# Patient Record
Sex: Male | Born: 1940 | Race: White | Hispanic: No | Marital: Married | Smoking: Never smoker
Health system: Southern US, Community
[De-identification: ages and names within clinical notes are randomized; demographics above are authoritative.]

## PROBLEM LIST (undated history)

## (undated) DIAGNOSIS — C241 Malignant neoplasm of ampulla of Vater: Secondary | ICD-10-CM

## (undated) DIAGNOSIS — E43 Unspecified severe protein-calorie malnutrition: Secondary | ICD-10-CM

## (undated) DIAGNOSIS — R066 Hiccough: Secondary | ICD-10-CM

## (undated) DIAGNOSIS — C4490 Unspecified malignant neoplasm of skin, unspecified: Secondary | ICD-10-CM

## (undated) DIAGNOSIS — I10 Essential (primary) hypertension: Secondary | ICD-10-CM

## (undated) DIAGNOSIS — C170 Malignant neoplasm of duodenum: Secondary | ICD-10-CM

## (undated) DIAGNOSIS — F410 Panic disorder [episodic paroxysmal anxiety] without agoraphobia: Secondary | ICD-10-CM

## (undated) DIAGNOSIS — B370 Candidal stomatitis: Secondary | ICD-10-CM

## (undated) DIAGNOSIS — E7800 Pure hypercholesterolemia, unspecified: Secondary | ICD-10-CM

## (undated) HISTORY — DX: Unspecified malignant neoplasm of skin, unspecified: C44.90

## (undated) HISTORY — DX: Malignant neoplasm of ampulla of Vater: C24.1

---

## 2013-08-20 ENCOUNTER — Other Ambulatory Visit (HOSPITAL_COMMUNITY): Payer: Self-pay | Admitting: Nurse Practitioner

## 2013-08-20 DIAGNOSIS — N508 Other specified disorders of male genital organs: Secondary | ICD-10-CM

## 2013-08-24 ENCOUNTER — Ambulatory Visit (HOSPITAL_COMMUNITY)
Admission: RE | Admit: 2013-08-24 | Discharge: 2013-08-24 | Disposition: A | Discharge status: Home / Self Care | Payer: Medicare Other | Source: Ambulatory Visit | Attending: Nurse Practitioner | Admitting: Nurse Practitioner

## 2013-08-24 ENCOUNTER — Other Ambulatory Visit (HOSPITAL_COMMUNITY): Payer: Self-pay | Admitting: Nurse Practitioner

## 2013-08-24 DIAGNOSIS — N433 Hydrocele, unspecified: Secondary | ICD-10-CM | POA: Insufficient documentation

## 2013-08-24 DIAGNOSIS — N508 Other specified disorders of male genital organs: Secondary | ICD-10-CM | POA: Insufficient documentation

## 2013-09-15 ENCOUNTER — Encounter (HOSPITAL_COMMUNITY): Payer: Self-pay | Admitting: Emergency Medicine

## 2013-09-15 ENCOUNTER — Inpatient Hospital Stay (HOSPITAL_COMMUNITY)
Admission: EM | Admit: 2013-09-15 | Discharge: 2013-09-17 | DRG: 445 | Disposition: A | Discharge status: Other Acute Inpatient Hospital | Payer: Medicare Other | Attending: Internal Medicine | Admitting: Internal Medicine

## 2013-09-15 ENCOUNTER — Emergency Department (HOSPITAL_COMMUNITY): Payer: Medicare Other

## 2013-09-15 DIAGNOSIS — N2889 Other specified disorders of kidney and ureter: Secondary | ICD-10-CM

## 2013-09-15 DIAGNOSIS — N19 Unspecified kidney failure: Secondary | ICD-10-CM

## 2013-09-15 DIAGNOSIS — I10 Essential (primary) hypertension: Secondary | ICD-10-CM

## 2013-09-15 DIAGNOSIS — K315 Obstruction of duodenum: Secondary | ICD-10-CM | POA: Diagnosis present

## 2013-09-15 DIAGNOSIS — N289 Disorder of kidney and ureter, unspecified: Secondary | ICD-10-CM

## 2013-09-15 DIAGNOSIS — C801 Malignant (primary) neoplasm, unspecified: Secondary | ICD-10-CM | POA: Diagnosis present

## 2013-09-15 DIAGNOSIS — K831 Obstruction of bile duct: Principal | ICD-10-CM

## 2013-09-15 DIAGNOSIS — R7402 Elevation of levels of lactic acid dehydrogenase (LDH): Secondary | ICD-10-CM | POA: Diagnosis present

## 2013-09-15 DIAGNOSIS — R7401 Elevation of levels of liver transaminase levels: Secondary | ICD-10-CM | POA: Diagnosis present

## 2013-09-15 DIAGNOSIS — Z87440 Personal history of urinary (tract) infections: Secondary | ICD-10-CM

## 2013-09-15 DIAGNOSIS — K869 Disease of pancreas, unspecified: Secondary | ICD-10-CM | POA: Diagnosis present

## 2013-09-15 DIAGNOSIS — N189 Chronic kidney disease, unspecified: Secondary | ICD-10-CM | POA: Diagnosis present

## 2013-09-15 DIAGNOSIS — D649 Anemia, unspecified: Secondary | ICD-10-CM | POA: Diagnosis present

## 2013-09-15 DIAGNOSIS — K3189 Other diseases of stomach and duodenum: Secondary | ICD-10-CM

## 2013-09-15 DIAGNOSIS — Z87442 Personal history of urinary calculi: Secondary | ICD-10-CM

## 2013-09-15 DIAGNOSIS — I129 Hypertensive chronic kidney disease with stage 1 through stage 4 chronic kidney disease, or unspecified chronic kidney disease: Secondary | ICD-10-CM | POA: Diagnosis present

## 2013-09-15 DIAGNOSIS — E7800 Pure hypercholesterolemia, unspecified: Secondary | ICD-10-CM | POA: Diagnosis present

## 2013-09-15 DIAGNOSIS — N179 Acute kidney failure, unspecified: Secondary | ICD-10-CM | POA: Diagnosis present

## 2013-09-15 DIAGNOSIS — N1330 Unspecified hydronephrosis: Secondary | ICD-10-CM | POA: Diagnosis present

## 2013-09-15 DIAGNOSIS — R17 Unspecified jaundice: Secondary | ICD-10-CM | POA: Diagnosis present

## 2013-09-15 DIAGNOSIS — E876 Hypokalemia: Secondary | ICD-10-CM | POA: Diagnosis present

## 2013-09-15 DIAGNOSIS — F410 Panic disorder [episodic paroxysmal anxiety] without agoraphobia: Secondary | ICD-10-CM | POA: Diagnosis present

## 2013-09-15 DIAGNOSIS — K319 Disease of stomach and duodenum, unspecified: Secondary | ICD-10-CM | POA: Diagnosis present

## 2013-09-15 HISTORY — DX: Panic disorder (episodic paroxysmal anxiety): F41.0

## 2013-09-15 HISTORY — DX: Essential (primary) hypertension: I10

## 2013-09-15 HISTORY — DX: Pure hypercholesterolemia, unspecified: E78.00

## 2013-09-15 LAB — CBC WITH DIFFERENTIAL/PLATELET
Basophils Absolute: 0 10*3/uL (ref 0.0–0.1)
Eosinophils Relative: 3 % (ref 0–5)
Lymphocytes Relative: 34 % (ref 12–46)
Lymphs Abs: 3.6 10*3/uL (ref 0.7–4.0)
MCH: 32.1 pg (ref 26.0–34.0)
MCV: 98 fL (ref 78.0–100.0)
Monocytes Relative: 8 % (ref 3–12)
Neutro Abs: 5.9 10*3/uL (ref 1.7–7.7)
Neutrophils Relative %: 56 % (ref 43–77)
Platelets: 489 10*3/uL — ABNORMAL HIGH (ref 150–400)
RBC: 3.92 MIL/uL — ABNORMAL LOW (ref 4.22–5.81)
WBC: 10.6 10*3/uL — ABNORMAL HIGH (ref 4.0–10.5)

## 2013-09-15 LAB — URINALYSIS, ROUTINE W REFLEX MICROSCOPIC
Glucose, UA: NEGATIVE mg/dL
Hgb urine dipstick: NEGATIVE
Leukocytes, UA: NEGATIVE
Nitrite: NEGATIVE
Protein, ur: NEGATIVE mg/dL
Specific Gravity, Urine: 1.02 (ref 1.005–1.030)
Urobilinogen, UA: 1 mg/dL (ref 0.0–1.0)

## 2013-09-15 LAB — COMPREHENSIVE METABOLIC PANEL
ALT: 140 U/L — ABNORMAL HIGH (ref 0–53)
AST: 115 U/L — ABNORMAL HIGH (ref 0–37)
Alkaline Phosphatase: 544 U/L — ABNORMAL HIGH (ref 39–117)
BUN: 24 mg/dL — ABNORMAL HIGH (ref 6–23)
CO2: 25 mEq/L (ref 19–32)
GFR calc Af Amer: 34 mL/min — ABNORMAL LOW (ref >90)
GFR calc non Af Amer: 29 mL/min — ABNORMAL LOW (ref >90)
Glucose, Bld: 102 mg/dL — ABNORMAL HIGH (ref 70–99)
Potassium: 3.4 mEq/L — ABNORMAL LOW (ref 3.5–5.1)
Sodium: 137 mEq/L (ref 135–145)

## 2013-09-15 LAB — LACTIC ACID, PLASMA: Lactic Acid, Venous: 1.4 mmol/L (ref 0.5–2.2)

## 2013-09-15 LAB — PROTIME-INR
INR: 1.03 (ref 0.00–1.49)
Prothrombin Time: 13.3 seconds (ref 11.6–15.2)

## 2013-09-15 LAB — SALICYLATE LEVEL: Salicylate Lvl: <2 mg/dL — ABNORMAL LOW (ref 2.8–20.0)

## 2013-09-15 LAB — ACETAMINOPHEN LEVEL: Acetaminophen (Tylenol), Serum: <15 ug/mL (ref 10–30)

## 2013-09-15 MED ORDER — ENOXAPARIN SODIUM 40 MG/0.4ML ~~LOC~~ SOLN: 40.0000 mg | SUBCUTANEOUS | Status: DC

## 2013-09-15 MED ORDER — IOHEXOL 300 MG/ML  SOLN
50.0000 mL | Freq: Once | INTRAMUSCULAR | Status: AC | PRN
  Administered 2013-09-15: 50 mL via ORAL

## 2013-09-15 MED ORDER — ONDANSETRON HCL 4 MG/2ML IJ SOLN: 4.0000 mg | Freq: Three times a day (TID) | INTRAMUSCULAR | Status: AC | PRN

## 2013-09-15 MED ORDER — SODIUM CHLORIDE 0.9 % IV SOLN: INTRAVENOUS | Status: DC

## 2013-09-15 MED ORDER — SODIUM CHLORIDE 0.9 % IV SOLN
INTRAVENOUS | Status: DC
  Administered 2013-09-15 – 2013-09-17 (×5): via INTRAVENOUS

## 2013-09-15 NOTE — H&P (Signed)
PCP:   Quinn Axe, PA-C   Chief Complaint:  Abnormal labs  HPI: 71 year old male with no significant medical problems who came to the ED after patient had a routine blood test at the PCP office which showed abnormal liver function test and elevated creatinine. Patient says that he was diagnosed with UTI in October and was prescribed antibiotics. Patient had symptoms of right upper quadrant pain with nausea at that time which resolved after 3 days. Patient was found to have elevated bilirubin in the urine so LFTs were done which were abnormal. Patient denies history of heavy alcohol abuse though he drinks one glass of wine occasionally at night. He denies any history of cancer, denies fever. Patient is totally asymptomatic at this time, he denies nausea vomiting or diarrhea no abdominal pain no fever no dysuria urgency frequency of urination. He denies constipation. Patient admits to having history of weight loss. In the ED CT scan of the abdominal was done which showed high-grade biliary obstruction secondary to peri-ampullary region mass 5 cm in dimension with evidence of extension beyond the duodenum with Molly Maduro of the proximal right ureter causing moderate hydronephrosis.  Allergies:   Allergies  Allergen Reactions  . Sulfa Antibiotics       Past Medical History  Diagnosis Date  . Hypertension   . Hypercholesterolemia   . Panic attacks     History reviewed. No pertinent past surgical history.  Prior to Admission medications   Not on File    Social History:  reports that he has never smoked. He does not have any smokeless tobacco history on file. He reports that he drinks alcohol. He reports that he does not use illicit drugs.  No family history on file.   All the positives are listed in BOLD  Review of Systems:  HEENT: Headache, blurred vision, runny nose, sore throat Neck: Hypothyroidism, hyperthyroidism,,lymphadenopathy Chest : Shortness of breath, history of  COPD, Asthma Heart : Chest pain, history of coronary arterey disease GI:  Nausea, vomiting, diarrhea, constipation, GERD GU: Dysuria, urgency, frequency of urination, hematuria Neuro: Stroke, seizures, syncope Psych: Depression, anxiety, hallucinations   Physical Exam: Blood pressure 126/68, pulse 80, temperature 98.2 F (36.8 C), temperature source Oral, resp. rate 20, height 5\' 11"  (1.803 m), weight 88.769 kg (195 lb 11.2 oz), SpO2 97.00%. Constitutional:   Patient is a well-developed and well-nourished male in no acute distress and cooperative with exam. Head: Normocephalic and atraumatic Mouth: Mucus membranes moist Eyes: PERRL, EOMI, conjunctivae normal Neck: Supple, No Thyromegaly Cardiovascular: RRR, S1 normal, S2 normal Pulmonary/Chest: CTAB, no wheezes, rales, or rhonchi Abdominal: Soft. Non-tender, non-distended, bowel sounds are normal, no masses, organomegaly, or guarding present.  Neurological: A&O x3, Strenght is normal and symmetric bilaterally, cranial nerve II-XII are grossly intact, no focal motor deficit, sensory intact to light touch bilaterally.  Extremities : No Cyanosis, Clubbing or Edema   Labs on Admission:  Results for orders placed during the hospital encounter of 09/15/13 (from the past 48 hour(s))  CBC WITH DIFFERENTIAL     Status: Abnormal   Collection Time    09/15/13  6:00 PM      Result Value Range   WBC 10.6 (*) 4.0 - 10.5 K/uL   RBC 3.92 (*) 4.22 - 5.81 MIL/uL   Hemoglobin 12.6 (*) 13.0 - 17.0 g/dL   HCT 16.1 (*) 09.6 - 04.5 %   MCV 98.0  78.0 - 100.0 fL   MCH 32.1  26.0 - 34.0 pg  MCHC 32.8  30.0 - 36.0 g/dL   RDW 29.5 (*) 62.1 - 30.8 %   Platelets 489 (*) 150 - 400 K/uL   Neutrophils Relative % 56  43 - 77 %   Neutro Abs 5.9  1.7 - 7.7 K/uL   Lymphocytes Relative 34  12 - 46 %   Lymphs Abs 3.6  0.7 - 4.0 K/uL   Monocytes Relative 8  3 - 12 %   Monocytes Absolute 0.8  0.1 - 1.0 K/uL   Eosinophils Relative 3  0 - 5 %   Eosinophils  Absolute 0.3  0.0 - 0.7 K/uL   Basophils Relative 0  0 - 1 %   Basophils Absolute 0.0  0.0 - 0.1 K/uL  COMPREHENSIVE METABOLIC PANEL     Status: Abnormal   Collection Time    09/15/13  6:00 PM      Result Value Range   Sodium 137  135 - 145 mEq/L   Potassium 3.4 (*) 3.5 - 5.1 mEq/L   Chloride 101  96 - 112 mEq/L   CO2 25  19 - 32 mEq/L   Glucose, Bld 102 (*) 70 - 99 mg/dL   BUN 24 (*) 6 - 23 mg/dL   Creatinine, Ser 6.57 (*) 0.50 - 1.35 mg/dL   Calcium 9.4  8.4 - 84.6 mg/dL   Total Protein 7.5  6.0 - 8.3 g/dL   Albumin 3.0 (*) 3.5 - 5.2 g/dL   AST 962 (*) 0 - 37 U/L   ALT 140 (*) 0 - 53 U/L   Alkaline Phosphatase 544 (*) 39 - 117 U/L   Total Bilirubin 6.8 (*) 0.3 - 1.2 mg/dL   GFR calc non Af Amer 29 (*) >90 mL/min   GFR calc Af Amer 34 (*) >90 mL/min   Comment: (NOTE)     The eGFR has been calculated using the CKD EPI equation.     This calculation has not been validated in all clinical situations.     eGFR's persistently <90 mL/min signify possible Chronic Kidney     Disease.  LIPASE, BLOOD     Status: None   Collection Time    09/15/13  6:00 PM      Result Value Range   Lipase 56  11 - 59 U/L  PROTIME-INR     Status: None   Collection Time    09/15/13  6:00 PM      Result Value Range   Prothrombin Time 13.3  11.6 - 15.2 seconds   INR 1.03  0.00 - 1.49  LACTIC ACID, PLASMA     Status: None   Collection Time    09/15/13  6:00 PM      Result Value Range   Lactic Acid, Venous 1.4  0.5 - 2.2 mmol/L  ACETAMINOPHEN LEVEL     Status: None   Collection Time    09/15/13  6:00 PM      Result Value Range   Acetaminophen (Tylenol), Serum <15.0  10 - 30 ug/mL   Comment:            THERAPEUTIC CONCENTRATIONS VARY     SIGNIFICANTLY. A RANGE OF 10-30     ug/mL MAY BE AN EFFECTIVE     CONCENTRATION FOR MANY PATIENTS.     HOWEVER, SOME ARE BEST TREATED     AT CONCENTRATIONS OUTSIDE THIS     RANGE.     ACETAMINOPHEN CONCENTRATIONS     >150 ug/mL AT 4 HOURS AFTER  INGESTION  AND >50 ug/mL AT 12     HOURS AFTER INGESTION ARE     OFTEN ASSOCIATED WITH TOXIC     REACTIONS.  SALICYLATE LEVEL     Status: Abnormal   Collection Time    09/15/13  6:00 PM      Result Value Range   Salicylate Lvl <2.0 (*) 2.8 - 20.0 mg/dL  ETHANOL     Status: None   Collection Time    09/15/13  6:00 PM      Result Value Range   Alcohol, Ethyl (B) <11  0 - 11 mg/dL   Comment:            LOWEST DETECTABLE LIMIT FOR     SERUM ALCOHOL IS 11 mg/dL     FOR MEDICAL PURPOSES ONLY  AMMONIA     Status: None   Collection Time    09/15/13  7:11 PM      Result Value Range   Ammonia 29  11 - 60 umol/L  URINALYSIS, ROUTINE W REFLEX MICROSCOPIC     Status: Abnormal   Collection Time    09/15/13  7:58 PM      Result Value Range   Color, Urine YELLOW  YELLOW   APPearance CLEAR  CLEAR   Specific Gravity, Urine 1.020  1.005 - 1.030   pH 6.0  5.0 - 8.0   Glucose, UA NEGATIVE  NEGATIVE mg/dL   Hgb urine dipstick NEGATIVE  NEGATIVE   Bilirubin Urine MODERATE (*) NEGATIVE   Ketones, ur NEGATIVE  NEGATIVE mg/dL   Protein, ur NEGATIVE  NEGATIVE mg/dL   Urobilinogen, UA 1.0  0.0 - 1.0 mg/dL   Nitrite NEGATIVE  NEGATIVE   Leukocytes, UA NEGATIVE  NEGATIVE   Comment: MICROSCOPIC NOT DONE ON URINES WITH NEGATIVE PROTEIN, BLOOD, LEUKOCYTES, NITRITE, OR GLUCOSE <1000 mg/dL.    Radiological Exams on Admission: Ct Abdomen Pelvis Wo Contrast  09/15/2013   CLINICAL DATA:  Jaundice  EXAM: CT ABDOMEN AND PELVIS WITHOUT CONTRAST  TECHNIQUE: Multidetector CT imaging of the abdomen and pelvis was performed following the standard protocol without intravenous contrast.  COMPARISON:  None.  FINDINGS: BODY WALL: Fatty right inguinal hernia.  LOWER CHEST: Unremarkable.  ABDOMEN/PELVIS:  Liver: No focal abnormality.  Biliary: Intra and extrahepatic biliary dilatation, with the common bile duct measuring up to 23 mm diameter. The duct is dilated although the way to the papilla, were there is a ill-defined mass  nearly filling the the duodenum, 5 cm in length by 2.3 cm in diameter. The posterior margin of the duodenal C-loop has surrounding fat infiltration, possibly involving the proximal upper ureter given moderate right hydronephrosis. No definitive tip adenopathy.  Cholelithiasis.  Pancreas: Appears discrete from the above mass. No ductal dilatation.  Spleen: Unremarkable.  Adrenals: Unremarkable.  Kidneys and ureters: Moderate right hydronephrosis, likely from extrinsic ureteral compression, as above. There is a water density mass exophytic from the upper pole left kidney, approximately 2 cm. On the right, there is a water density mass exophytic from the interpolar region. There is a soft tissue density mass exophytic from the lower pole right kidney, 2 cm in diameter.  Bladder: Unremarkable.  Reproductive: Unremarkable.  Bowel: Mid duodenal mass as above. Colonic diverticulosis. No bowel obstruction. Normal appendix.  Retroperitoneum: No mass or adenopathy.  Peritoneum: No free fluid or gas.  Vascular: No acute abnormality.  OSSEOUS: No acute abnormalities.  Right-sided L5 pars defect.  IMPRESSION: 1. High-grade biliary obstruction secondary to periampullary region  mass, 5 cm in maximal dimension. Endoscopy would likely allow for tissue sampling. There is evidence of extension beyond the duodenum, with involvement of the proximal right ureter - causing moderate hydronephrosis. 2. Indeterminate 2 cm right renal mass, which could be a complicated cyst or solid neoplasm.   Electronically Signed   By: [name]   On: 09/15/2013 21:06    Assessment/Plan Active Problems:   Biliary obstruction   Biliary obstruction due to malignant neoplasm  72 year old male with no significant medical problems admitted with abnormal LFTs, jaundice, moderate hydronephrosis secondary to mass encroaching upon proximal right ureter. Patient will need GI to evaluate for possible endoscopy in the morning. We'll consult GI in  the morning. Patient will need a biopsy of the mass to further evaluate the etiology. Patient does have elevated creatinine 2.17 likely due to hydronephrosis. Based on the evaluation as per endoscopy, consider urology consult in the morning.  Code status: Patient is full code  Family discussion: Discussed with patient's wife at bedside   Time Spent on Admission: 55 min  [facility] Pager: [phone] 09/15/2013, 11:41 PM  If 7PM-7AM, please contact night-coverage  [email or web address]  Password [email or web address]

## 2013-09-15 NOTE — ED Provider Notes (Signed)
CSN: [id number]     Arrival date & time 09/15/13  1732 History  This chart was scribed for Trevor Octave, MD by Danella Maiers, ED Scribe. This patient was seen in room APA06/APA06 and the patient's care was started at 5:58 PM.    Chief Complaint  Patient presents with  . abnormal labs    The history is provided by the patient. No language interpreter was used.   HPI Comments: [name] is a 72 y.o. male who presents to the Emergency Department from having a routine blood test at his PCP because he has large amount of bilirubin in urine, abnormal liver tests, and elevated creatinine. Pt reports he was treated for UTI in October. Pt states he has been told he looks yellow since this admit in October. Pt also reports one episode vomiting last week. He states he had one mixed drink in October that made his stomach feel like it was on fire and has not drank alcohol since. He states before this, he drank alcohol once in a while. He denies recent illness, abdominal pain, fatigue, fever, cough, diarrhea, constipation. He still has his appendix and gall bladder. He is otherwise healthy.    Past Medical History  Diagnosis Date  . Hypertension   . Hypercholesterolemia   . Panic attacks    History reviewed. No pertinent past surgical history. No family history on file. History  Substance Use Topics  . Smoking status: Never Smoker   . Smokeless tobacco: Not on file  . Alcohol Use: Yes     Comment: reports drank moderately until Oct 15th, none since then.    Review of Systems  Constitutional: Negative for fatigue.  Respiratory: Negative for cough.   Gastrointestinal: Positive for vomiting (last week). Negative for nausea, abdominal pain, diarrhea and constipation.   A complete 10 system review of systems was obtained and all systems are negative except as noted in the HPI and PMH.   Allergies  Sulfa antibiotics  Home Medications  No current outpatient prescriptions on file. BP  128/71  Pulse 97  Temp(Src) 97.8 F (36.6 C) (Oral)  Resp 20  Ht 5\' 11"  (1.803 m)  Wt 194 lb (87.998 kg)  BMI 27.07 kg/m2  SpO2 100% Physical Exam  Nursing note and vitals reviewed. Constitutional: He is oriented to person, place, and time. He appears well-developed and well-nourished. No distress.  Mild jaundice  HENT:  Head: Normocephalic and atraumatic.  Mouth/Throat: Oropharynx is clear and moist. No oropharyngeal exudate.  Eyes: EOM are normal. Scleral icterus is present.  Neck: Neck supple. No tracheal deviation present.  Cardiovascular: Normal rate, regular rhythm and normal heart sounds.   No murmur heard. Pulmonary/Chest: Effort normal. No respiratory distress.  Abdominal: Soft. There is no tenderness. There is no rebound and no guarding.  Musculoskeletal: Normal range of motion.  Neurological: He is alert and oriented to person, place, and time. No cranial nerve deficit. He exhibits normal muscle tone. Coordination normal.  No asterixis  Skin: Skin is warm and dry.  Psychiatric: He has a normal mood and affect. His behavior is normal.    ED Course  Procedures (including critical care time) Medications  0.9 %  sodium chloride infusion (not administered)  ondansetron (ZOFRAN) injection 4 mg (not administered)  iohexol (OMNIPAQUE) 300 MG/ML solution 50 mL (50 mLs Oral Contrast Given 09/15/13 1912)    DIAGNOSTIC STUDIES: Oxygen Saturation is 100% on RA, normal by my interpretation.    COORDINATION OF CARE: 6:12 PM- Discussed  treatment plan with pt which includes lab work. Pt agrees to plan.  9:00 PM- Rechecked with pt to let him know that we are still waiting on CT and pt will probably need to be admitted. Pt agrees to plan.    Labs Review Labs Reviewed  CBC WITH DIFFERENTIAL - Abnormal; Notable for the following:    WBC 10.6 (*)    RBC 3.92 (*)    Hemoglobin 12.6 (*)    HCT 38.4 (*)    RDW 16.4 (*)    Platelets 489 (*)    All other components within  normal limits  COMPREHENSIVE METABOLIC PANEL - Abnormal; Notable for the following:    Potassium 3.4 (*)    Glucose, Bld 102 (*)    BUN 24 (*)    Creatinine, Ser 2.17 (*)    Albumin 3.0 (*)    AST 115 (*)    ALT 140 (*)    Alkaline Phosphatase 544 (*)    Total Bilirubin 6.8 (*)    GFR calc non Af Amer 29 (*)    GFR calc Af Amer 34 (*)    All other components within normal limits  URINALYSIS, ROUTINE W REFLEX MICROSCOPIC - Abnormal; Notable for the following:    Bilirubin Urine MODERATE (*)    All other components within normal limits  SALICYLATE LEVEL - Abnormal; Notable for the following:    Salicylate Lvl <2.0 (*)    All other components within normal limits  LIPASE, BLOOD  PROTIME-INR  AMMONIA  LACTIC ACID, PLASMA  ACETAMINOPHEN LEVEL  ETHANOL   Imaging Review Ct Abdomen Pelvis Wo Contrast  09/15/2013   CLINICAL DATA:  Jaundice  EXAM: CT ABDOMEN AND PELVIS WITHOUT CONTRAST  TECHNIQUE: Multidetector CT imaging of the abdomen and pelvis was performed following the standard protocol without intravenous contrast.  COMPARISON:  None.  FINDINGS: BODY WALL: Fatty right inguinal hernia.  LOWER CHEST: Unremarkable.  ABDOMEN/PELVIS:  Liver: No focal abnormality.  Biliary: Intra and extrahepatic biliary dilatation, with the common bile duct measuring up to 23 mm diameter. The duct is dilated although the way to the papilla, were there is a ill-defined mass nearly filling the the duodenum, 5 cm in length by 2.3 cm in diameter. The posterior margin of the duodenal C-loop has surrounding fat infiltration, possibly involving the proximal upper ureter given moderate right hydronephrosis. No definitive tip adenopathy.  Cholelithiasis.  Pancreas: Appears discrete from the above mass. No ductal dilatation.  Spleen: Unremarkable.  Adrenals: Unremarkable.  Kidneys and ureters: Moderate right hydronephrosis, likely from extrinsic ureteral compression, as above. There is a water density mass exophytic from  the upper pole left kidney, approximately 2 cm. On the right, there is a water density mass exophytic from the interpolar region. There is a soft tissue density mass exophytic from the lower pole right kidney, 2 cm in diameter.  Bladder: Unremarkable.  Reproductive: Unremarkable.  Bowel: Mid duodenal mass as above. Colonic diverticulosis. No bowel obstruction. Normal appendix.  Retroperitoneum: No mass or adenopathy.  Peritoneum: No free fluid or gas.  Vascular: No acute abnormality.  OSSEOUS: No acute abnormalities.  Right-sided L5 pars defect.  IMPRESSION: 1. High-grade biliary obstruction secondary to periampullary region mass, 5 cm in maximal dimension. Endoscopy would likely allow for tissue sampling. There is evidence of extension beyond the duodenum, with involvement of the proximal right ureter - causing moderate hydronephrosis. 2. Indeterminate 2 cm right renal mass, which could be a complicated cyst or solid neoplasm.   Electronically  Signed   By: [name]   On: 09/15/2013 21:06    EKG Interpretation   None       MDM   1. Biliary obstruction   2. Renal failure   3. Renal mass   4. Duodenal mass    Lab abnormalities on outpatient. No abdominal pain, fever, vomiting, chest pain, SOB.  Hyperbilirubinemia with transaminitis. Lipase negative. Creatinine 2.0.  Concern for biliary or pancreas pathology given patient's jaundice and lab abnormalities. He denies any abdominal pain, nausea vomiting.  Imaging shows biliary obstruction from periampullar mass. Moderate hydronephrosis from same. Also cystic lesion on kidney.  Concern for malignancy. Patient will need admission for further evaluation and endoscopy. I personally performed the services described in this documentation, which was scribed in my presence. The recorded information has been reviewed and is accurate.    Trevor Octave, MD 09/15/13 2139

## 2013-09-15 NOTE — ED Notes (Signed)
Pt reports was treated for UTI back in October.  Reports was sent today by PCP because has large amount of bilirubin in urine, abnormal liver tests, and elevated creatinine.  Pt also appears jaundice.  Office staff also reports pt c/o some abdominal discomfort with n/v after eating certain foods.

## 2013-09-16 ENCOUNTER — Encounter (HOSPITAL_COMMUNITY)
Admission: EM | Disposition: A | Discharge status: Other Acute Inpatient Hospital | Payer: Self-pay | Source: Home / Self Care | Attending: Internal Medicine

## 2013-09-16 ENCOUNTER — Encounter (HOSPITAL_COMMUNITY): Payer: Self-pay | Admitting: Anesthesiology

## 2013-09-16 ENCOUNTER — Encounter (HOSPITAL_COMMUNITY): Payer: Self-pay | Admitting: *Deleted

## 2013-09-16 DIAGNOSIS — N179 Acute kidney failure, unspecified: Secondary | ICD-10-CM | POA: Diagnosis present

## 2013-09-16 DIAGNOSIS — I10 Essential (primary) hypertension: Secondary | ICD-10-CM

## 2013-09-16 DIAGNOSIS — R17 Unspecified jaundice: Secondary | ICD-10-CM

## 2013-09-16 DIAGNOSIS — N1330 Unspecified hydronephrosis: Secondary | ICD-10-CM | POA: Diagnosis present

## 2013-09-16 LAB — URINALYSIS, ROUTINE W REFLEX MICROSCOPIC
Ketones, ur: NEGATIVE mg/dL
Leukocytes, UA: NEGATIVE
Nitrite: NEGATIVE
Specific Gravity, Urine: 1.02 (ref 1.005–1.030)
Urobilinogen, UA: 1 mg/dL (ref 0.0–1.0)
pH: 6 (ref 5.0–8.0)

## 2013-09-16 LAB — BASIC METABOLIC PANEL
BUN: 21 mg/dL (ref 6–23)
Calcium: 9.9 mg/dL (ref 8.4–10.5)
Chloride: 101 mEq/L (ref 96–112)
Creatinine, Ser: 1.92 mg/dL — ABNORMAL HIGH (ref 0.50–1.35)
GFR calc Af Amer: 39 mL/min — ABNORMAL LOW (ref >90)
Glucose, Bld: 118 mg/dL — ABNORMAL HIGH (ref 70–99)

## 2013-09-16 LAB — SODIUM, URINE, RANDOM: Sodium, Ur: 116 mEq/L

## 2013-09-16 LAB — CREATININE, URINE, RANDOM: Creatinine, Urine: 117.39 mg/dL

## 2013-09-16 SURGERY — CANCELLED PROCEDURE

## 2013-09-16 MED ORDER — LACTATED RINGERS IV SOLN: INTRAVENOUS | Status: DC

## 2013-09-16 MED ORDER — CEFAZOLIN SODIUM-DEXTROSE 2-3 GM-% IV SOLR
2.0000 g | Freq: Once | INTRAVENOUS | Status: AC
  Filled 2013-09-16: qty 50

## 2013-09-16 MED ORDER — ONDANSETRON HCL 4 MG/2ML IJ SOLN: 4.0000 mg | Freq: Once | INTRAMUSCULAR | Status: DC | PRN

## 2013-09-16 MED ORDER — FENTANYL CITRATE 0.05 MG/ML IJ SOLN: 25.0000 ug | INTRAMUSCULAR | Status: DC | PRN

## 2013-09-16 MED ORDER — ENOXAPARIN SODIUM 40 MG/0.4ML ~~LOC~~ SOLN
SUBCUTANEOUS | Status: AC
  Filled 2013-09-16: qty 0.4

## 2013-09-16 MED ORDER — ONDANSETRON HCL 4 MG/2ML IJ SOLN: 4.0000 mg | Freq: Once | INTRAMUSCULAR | Status: DC

## 2013-09-16 MED ORDER — ONDANSETRON HCL 4 MG/2ML IJ SOLN
INTRAMUSCULAR | Status: AC
  Filled 2013-09-16: qty 2

## 2013-09-16 MED ORDER — SODIUM CHLORIDE 0.9 % IV SOLN: INTRAVENOUS | Status: DC

## 2013-09-16 MED ORDER — MIDAZOLAM HCL 2 MG/2ML IJ SOLN: 1.0000 mg | INTRAMUSCULAR | Status: DC | PRN

## 2013-09-16 MED ORDER — FENTANYL CITRATE 0.05 MG/ML IJ SOLN
INTRAMUSCULAR | Status: AC
  Filled 2013-09-16: qty 2

## 2013-09-16 MED ORDER — GLYCOPYRROLATE 0.2 MG/ML IJ SOLN: 0.2000 mg | Freq: Once | INTRAMUSCULAR | Status: DC

## 2013-09-16 MED ORDER — MIDAZOLAM HCL 2 MG/2ML IJ SOLN
INTRAMUSCULAR | Status: AC
  Filled 2013-09-16: qty 2

## 2013-09-16 SURGICAL SUPPLY — 40 items
BAG HAMPER (MISCELLANEOUS) ×3 IMPLANT
BALLN RETRIEVAL 12X15 (BALLOONS) IMPLANT
BASKET TRAPEZOID 3X6 (MISCELLANEOUS) IMPLANT
BLOCK BITE 60FR ADLT L/F BLUE (MISCELLANEOUS) ×3 IMPLANT
DEVICE INFLATION ENCORE 26 (MISCELLANEOUS) IMPLANT
DEVICE LOCKING W-BIOPSY CAP (MISCELLANEOUS) ×3 IMPLANT
ELECT REM PT RETURN 9FT ADLT (ELECTROSURGICAL)
ELECTRODE REM PT RTRN 9FT ADLT (ELECTROSURGICAL) IMPLANT
FLOOR PAD 36X40 (MISCELLANEOUS)
FORCEP COLD BIOPSY (CUTTING FORCEPS) IMPLANT
FORCEP RJ3 GP 1.8X160 W-NEEDLE (CUTTING FORCEPS) IMPLANT
FORCEPS BIOP RAD 4 LRG CAP 4 (CUTTING FORCEPS) IMPLANT
GUIDEWIRE HYDRA JAGWIRE .35 (WIRE) IMPLANT
GUIDEWIRE JAG HINI 025X260CM (WIRE) IMPLANT
KIT CLEAN ENDO COMPLIANCE (KITS) ×3 IMPLANT
KIT ROOM TURNOVER APOR (KITS) ×3 IMPLANT
LUBRICANT JELLY 4.5OZ STERILE (MISCELLANEOUS) IMPLANT
MANIFOLD NEPTUNE WASTE (CANNULA) IMPLANT
NEEDLE HYPO 18GX1.5 BLUNT FILL (NEEDLE) IMPLANT
NEEDLE SCLEROTHERAPY 25GX240 (NEEDLE) IMPLANT
PAD ARMBOARD 7.5X6 YLW CONV (MISCELLANEOUS) ×3 IMPLANT
PAD FLOOR 36X40 (MISCELLANEOUS) IMPLANT
PATHFINDER 450CM 0.18 (STENTS) IMPLANT
POSITIONER HEAD 8X9X4 ADT (SOFTGOODS) IMPLANT
PROBE APC STR FIRE (PROBE) IMPLANT
PROBE INJECTION GOLD (MISCELLANEOUS)
PROBE INJECTION GOLD 7FR (MISCELLANEOUS) IMPLANT
SNARE ROTATE MED OVAL 20MM (MISCELLANEOUS) IMPLANT
SNARE SHORT THROW 13M SML OVAL (MISCELLANEOUS) ×3 IMPLANT
SPHINCTEROTOME AUTOTOME .25 (MISCELLANEOUS) ×6 IMPLANT
SPHINCTEROTOME HYDRATOME 44 (MISCELLANEOUS) ×6 IMPLANT
SPONGE GAUZE 4X4 12PLY (GAUZE/BANDAGES/DRESSINGS) ×3 IMPLANT
SYR 3ML LL SCALE MARK (SYRINGE) IMPLANT
SYR 50ML LL SCALE MARK (SYRINGE) IMPLANT
SYSTEM CONTINUOUS INJECTION (MISCELLANEOUS) ×3 IMPLANT
TUBING ENDO SMARTCAP PENTAX (MISCELLANEOUS) ×3 IMPLANT
TUBING IRRIGATION ENDOGATOR (MISCELLANEOUS) ×3 IMPLANT
WALLSTENT METAL COVERED 10X60 (STENTS) IMPLANT
WALLSTENT METAL COVERED 10X80 (STENTS) IMPLANT
WATER STERILE IRR 1000ML POUR (IV SOLUTION) ×3 IMPLANT

## 2013-09-16 NOTE — Consult Note (Signed)
Schedule renal ultrasound tonite

## 2013-09-16 NOTE — Consult Note (Signed)
Reason for Consult: jaundice Referring Physician: Hospitalist  Odessa Morren is an 72 y.o. male.  HPI: Admitted thru the ED yesterday. He has been seen at [facility] and noted his bilirubin was very elevated. He says he has noticed yellowing of his skin for about 3 weeks. Admission bilirubin 6.8. He underwent a CT in the ED which revealed IMPRESSION:  1. High-grade biliary obstruction secondary to periampullary region  mass, 5 cm in maximal dimension. Endoscopy would likely allow for  tissue sampling. There is evidence of extension beyond the duodenum,  with involvement of the proximal right ureter - causing moderate  hydronephrosis.  2. Indeterminate 2 cm right renal mass, which could be a complicated  cyst or solid neoplasm.  He denies any pain. He tells me he had a UTI and treated with Cipro last month. He had pain under his rt rib cage. He has had recent weight loss of about 15 pounds. Appetite has been good. BMs are normal.  No problems with urination. He occasionally has a cocktail but not everyday. Has not drank x 2 months.   Past Medical History  Diagnosis Date  . Hypertension   . Hypercholesterolemia   . Panic attacks     History reviewed. No pertinent past surgical history.  No family history on file.  Social History:  reports that he has never smoked. He does not have any smokeless tobacco history on file. He reports that he drinks alcohol. He reports that he does not use illicit drugs.  Allergies:  Allergies  Allergen Reactions  . Sulfa Antibiotics     Medications: I have reviewed the patient's current medications.  Results for orders placed during the hospital encounter of 09/15/13 (from the past 48 hour(s))  CBC WITH DIFFERENTIAL     Status: Abnormal   Collection Time    09/15/13  6:00 PM      Result Value Range   WBC 10.6 (*) 4.0 - 10.5 K/uL   RBC 3.92 (*) 4.22 - 5.81 MIL/uL   Hemoglobin 12.6 (*) 13.0 - 17.0 g/dL   HCT 40.1 (*) 02.7 -  52.0 %   MCV 98.0  78.0 - 100.0 fL   MCH 32.1  26.0 - 34.0 pg   MCHC 32.8  30.0 - 36.0 g/dL   RDW 25.3 (*) 66.4 - 40.3 %   Platelets 489 (*) 150 - 400 K/uL   Neutrophils Relative % 56  43 - 77 %   Neutro Abs 5.9  1.7 - 7.7 K/uL   Lymphocytes Relative 34  12 - 46 %   Lymphs Abs 3.6  0.7 - 4.0 K/uL   Monocytes Relative 8  3 - 12 %   Monocytes Absolute 0.8  0.1 - 1.0 K/uL   Eosinophils Relative 3  0 - 5 %   Eosinophils Absolute 0.3  0.0 - 0.7 K/uL   Basophils Relative 0  0 - 1 %   Basophils Absolute 0.0  0.0 - 0.1 K/uL  COMPREHENSIVE METABOLIC PANEL     Status: Abnormal   Collection Time    09/15/13  6:00 PM      Result Value Range   Sodium 137  135 - 145 mEq/L   Potassium 3.4 (*) 3.5 - 5.1 mEq/L   Chloride 101  96 - 112 mEq/L   CO2 25  19 - 32 mEq/L   Glucose, Bld 102 (*) 70 - 99 mg/dL   BUN 24 (*) 6 - 23 mg/dL   Creatinine, Ser  2.17 (*) 0.50 - 1.35 mg/dL   Calcium 9.4  8.4 - 16.1 mg/dL   Total Protein 7.5  6.0 - 8.3 g/dL   Albumin 3.0 (*) 3.5 - 5.2 g/dL   AST 096 (*) 0 - 37 U/L   ALT 140 (*) 0 - 53 U/L   Alkaline Phosphatase 544 (*) 39 - 117 U/L   Total Bilirubin 6.8 (*) 0.3 - 1.2 mg/dL   GFR calc non Af Amer 29 (*) >90 mL/min   GFR calc Af Amer 34 (*) >90 mL/min   Comment: (NOTE)     The eGFR has been calculated using the CKD EPI equation.     This calculation has not been validated in all clinical situations.     eGFR's persistently <90 mL/min signify possible Chronic Kidney     Disease.  LIPASE, BLOOD     Status: None   Collection Time    09/15/13  6:00 PM      Result Value Range   Lipase 56  11 - 59 U/L  PROTIME-INR     Status: None   Collection Time    09/15/13  6:00 PM      Result Value Range   Prothrombin Time 13.3  11.6 - 15.2 seconds   INR 1.03  0.00 - 1.49  LACTIC ACID, PLASMA     Status: None   Collection Time    09/15/13  6:00 PM      Result Value Range   Lactic Acid, Venous 1.4  0.5 - 2.2 mmol/L  ACETAMINOPHEN LEVEL     Status: None   Collection  Time    09/15/13  6:00 PM      Result Value Range   Acetaminophen (Tylenol), Serum <15.0  10 - 30 ug/mL   Comment:            THERAPEUTIC CONCENTRATIONS VARY     SIGNIFICANTLY. A RANGE OF 10-30     ug/mL MAY BE AN EFFECTIVE     CONCENTRATION FOR MANY PATIENTS.     HOWEVER, SOME ARE BEST TREATED     AT CONCENTRATIONS OUTSIDE THIS     RANGE.     ACETAMINOPHEN CONCENTRATIONS     >150 ug/mL AT 4 HOURS AFTER     INGESTION AND >50 ug/mL AT 12     HOURS AFTER INGESTION ARE     OFTEN ASSOCIATED WITH TOXIC     REACTIONS.  SALICYLATE LEVEL     Status: Abnormal   Collection Time    09/15/13  6:00 PM      Result Value Range   Salicylate Lvl <2.0 (*) 2.8 - 20.0 mg/dL  ETHANOL     Status: None   Collection Time    09/15/13  6:00 PM      Result Value Range   Alcohol, Ethyl (B) <11  0 - 11 mg/dL   Comment:            LOWEST DETECTABLE LIMIT FOR     SERUM ALCOHOL IS 11 mg/dL     FOR MEDICAL PURPOSES ONLY  AMMONIA     Status: None   Collection Time    09/15/13  7:11 PM      Result Value Range   Ammonia 29  11 - 60 umol/L  URINALYSIS, ROUTINE W REFLEX MICROSCOPIC     Status: Abnormal   Collection Time    09/15/13  7:58 PM      Result Value Range   Color, Urine YELLOW  YELLOW   APPearance CLEAR  CLEAR   Specific Gravity, Urine 1.020  1.005 - 1.030   pH 6.0  5.0 - 8.0   Glucose, UA NEGATIVE  NEGATIVE mg/dL   Hgb urine dipstick NEGATIVE  NEGATIVE   Bilirubin Urine MODERATE (*) NEGATIVE   Ketones, ur NEGATIVE  NEGATIVE mg/dL   Protein, ur NEGATIVE  NEGATIVE mg/dL   Urobilinogen, UA 1.0  0.0 - 1.0 mg/dL   Nitrite NEGATIVE  NEGATIVE   Leukocytes, UA NEGATIVE  NEGATIVE   Comment: MICROSCOPIC NOT DONE ON URINES WITH NEGATIVE PROTEIN, BLOOD, LEUKOCYTES, NITRITE, OR GLUCOSE <1000 mg/dL.    Ct Abdomen Pelvis Wo Contrast  09/15/2013   CLINICAL DATA:  Jaundice  EXAM: CT ABDOMEN AND PELVIS WITHOUT CONTRAST  TECHNIQUE: Multidetector CT imaging of the abdomen and pelvis was performed following  the standard protocol without intravenous contrast.  COMPARISON:  None.  FINDINGS: BODY WALL: Fatty right inguinal hernia.  LOWER CHEST: Unremarkable.  ABDOMEN/PELVIS:  Liver: No focal abnormality.  Biliary: Intra and extrahepatic biliary dilatation, with the common bile duct measuring up to 23 mm diameter. The duct is dilated although the way to the papilla, were there is a ill-defined mass nearly filling the the duodenum, 5 cm in length by 2.3 cm in diameter. The posterior margin of the duodenal C-loop has surrounding fat infiltration, possibly involving the proximal upper ureter given moderate right hydronephrosis. No definitive tip adenopathy.  Cholelithiasis.  Pancreas: Appears discrete from the above mass. No ductal dilatation.  Spleen: Unremarkable.  Adrenals: Unremarkable.  Kidneys and ureters: Moderate right hydronephrosis, likely from extrinsic ureteral compression, as above. There is a water density mass exophytic from the upper pole left kidney, approximately 2 cm. On the right, there is a water density mass exophytic from the interpolar region. There is a soft tissue density mass exophytic from the lower pole right kidney, 2 cm in diameter.  Bladder: Unremarkable.  Reproductive: Unremarkable.  Bowel: Mid duodenal mass as above. Colonic diverticulosis. No bowel obstruction. Normal appendix.  Retroperitoneum: No mass or adenopathy.  Peritoneum: No free fluid or gas.  Vascular: No acute abnormality.  OSSEOUS: No acute abnormalities.  Right-sided L5 pars defect.  IMPRESSION: 1. High-grade biliary obstruction secondary to periampullary region mass, 5 cm in maximal dimension. Endoscopy would likely allow for tissue sampling. There is evidence of extension beyond the duodenum, with involvement of the proximal right ureter - causing moderate hydronephrosis. 2. Indeterminate 2 cm right renal mass, which could be a complicated cyst or solid neoplasm.   Electronically Signed   By: [name]   On:  09/15/2013 21:06    ROS Blood pressure 139/83, pulse 83, temperature 97.6 F (36.4 C), temperature source Oral, resp. rate 20, height 5\' 11"  (1.803 m), weight 195 lb 11.2 oz (88.769 kg), SpO2 98.00%. Physical Exam Alert and oriented. Skin warm and dry. Oral mucosa is moist.   . Sclera anicteric, conjunctivae is pink. Thyroid not enlarged. No cervical lymphadenopathy. Lungs clear. Heart regular rate and rhythm.  Abdomen is soft. Bowel sounds are positive. No hepatomegaly. No abdominal masses felt. No tenderness.    Assessment/Plan: High grade biliary obstruction secondary to periampullary region mass. ERCP today with Dr. Siri Cole 09/16/2013, 8:16 AM     GI attending note; Patient interviewed and examined. Abdominal pelvic CT reviewed along with Dr. Orlinda Blalock. Patient has CBD obstruction either secondary to ampullary or duodenal mass. If CBD can be accessed bile duct could be decompressed with plastic  stent otherwise he may need external/internal biliary stent. He also has indeterminate right renal mass to be further evaluated. Patient will undergo EGD and ERCP with biliary stenting. He will also need EUS for staging to determine if he is candidate for surgical intervention. Procedures and risks were explained to the patient and his wife and they're both agreeable. EGD and ERCP will be performed later today.

## 2013-09-16 NOTE — Op Note (Signed)
Ercp canceled for today per dr Karilyn Cota to be rescheduled for Friday

## 2013-09-16 NOTE — Progress Notes (Signed)
INITIAL NUTRITION ASSESSMENT  DOCUMENTATION CODES Per approved criteria  -Not Applicable   INTERVENTION: Follow diet advancement and provide nutrition care as indicated  NUTRITION DIAGNOSIS: Inadequate oral intake related to inability to eat as evidenced by NPO status.  Goal: Pt to meet >/= 90% of their estimated nutrition needs   Monitor:  Diet advancement, percent po intake, labs and wt trends   Reason for Assessment: Malnutrition Screen   72 y.o. male  Patient Active Problem List   Diagnosis Date Noted  . Biliary obstruction 09/15/2013  . Biliary obstruction due to malignant neoplasm 09/15/2013   ASSESSMENT:  CT identified biliary obstruction due to 5 cm periampullary mass and renal mass. He has experienced wt loss of 12#, 6% since October. He received Heart healthy meal at lunch and observed intake >75%. He reports to be hungry and denies any nausea or pain since finishing his meal. NPO after midnight for ERCP.  Height: Ht Readings from Last 1 Encounters:  09/15/13 5\' 11"  (1.803 m)    Weight: Wt Readings from Last 1 Encounters:  09/15/13 195 lb 11.2 oz (88.769 kg)    Ideal Body Weight: 172# (78.1 kg)  % Ideal Body Weight: 114%  Wt Readings from Last 10 Encounters:  09/15/13 195 lb 11.2 oz (88.769 kg)  09/15/13 195 lb 11.2 oz (88.769 kg)    Usual Body Weight: 208-211##  % Usual Body Weight: 94%  BMI:  Body mass index is 27.31 kg/(m^2). overweight  Estimated Nutritional Needs: Kcal: 2225-2492 Protein: 98-107 gr Fluid: 2.2 liters daily (normal needs)  Skin: No issues noted  EDUCATION NEEDS: -Education needs addressed   Intake/Output Summary (Last 24 hours) at 09/16/13 1055 Last data filed at 09/16/13 0500  Gross per 24 hour  Intake 1117.5 ml  Output      0 ml  Net 1117.5 ml    Last BM:  09/14/13  Labs:   Recent Labs Lab 09/15/13 1800  NA 137  K 3.4*  CL 101  CO2 25  BUN 24*  CREATININE 2.17*  CALCIUM 9.4  GLUCOSE 102*    CBG  (last 3)  No results found for this basename: GLUCAP,  in the last 72 hours  Scheduled Meds: .  ceFAZolin (ANCEF) IV  2 g Intravenous Once    Continuous Infusions: . sodium chloride 75 mL/hr at 09/15/13 2342  . sodium chloride      Past Medical History  Diagnosis Date  . Hypertension   . Hypercholesterolemia   . Panic attacks     History reviewed. No pertinent past surgical history.  Royann Shivers MS,RD,CSG,LDN Office: [phone] Pager: [phone]

## 2013-09-16 NOTE — Progress Notes (Signed)
UR chart review completed.  

## 2013-09-16 NOTE — Progress Notes (Addendum)
[facility] PROGRESS NOTE  Trevor Crane XBJ:[id number] DOB: November 14, 1940 DOA: 09/15/2013  PCP: Quinn Axe, PA-C  Brief HPI: 72 year old male with no significant medical problems who came to the ED after patient had a routine blood test at the PCP office which showed abnormal liver function test and elevated creatinine. Patient was found to have elevated bilirubin in the urine so LFTs were done which were abnormal. Patient admits to having history of weight loss. In the ED CT scan of the abdominal was done which showed high-grade biliary obstruction secondary to peri-ampullary region mass 5 cm in dimension with evidence of extension beyond the duodenum with involvement of the proximal right ureter causing moderate hydronephrosis.  Past medical history:  Past Medical History  Diagnosis Date  . Hypertension   . Hypercholesterolemia   . Panic attacks     Consultants: Dr. Karilyn Cota, Dr. Jerre Simon, Dr. Kristian Covey  Procedures: ERCP rescheduled for 12/19  Antibiotics: None  Subjective: Patient denies any complaints. No pain or nausea or vomiting.   Objective: Vital Signs  Filed Vitals:   09/16/13 1110 09/16/13 1115 09/16/13 1120 09/16/13 1125  BP: 145/77 133/77 129/77 129/80  Pulse:      Temp:      TempSrc:      Resp: 22 20 18 19   Height:      Weight:      SpO2: 99% 95% 96% 98%    Intake/Output Summary (Last 24 hours) at 09/16/13 1259 Last data filed at 09/16/13 0500  Gross per 24 hour  Intake 1117.5 ml  Output      0 ml  Net 1117.5 ml   Filed Weights   09/15/13 1750 09/15/13 2233  Weight: 87.998 kg (194 lb) 88.769 kg (195 lb 11.2 oz)    General appearance: alert, cooperative, icteric and no distress Resp: clear to auscultation bilaterally Cardio: regular rate and rhythm, S1, S2 normal, no murmur, click, rub or gallop GI: soft, non-tender; bowel sounds normal; no masses,  no organomegaly. Slight fullness in epigastrium. Extremities: extremities normal,  atraumatic, no cyanosis or edema Neurologic: Alert and oriented x 3. No focal deficits.  Lab Results:  Basic Metabolic Panel:  Recent Labs Lab 09/15/13 1800  NA 137  K 3.4*  CL 101  CO2 25  GLUCOSE 102*  BUN 24*  CREATININE 2.17*  CALCIUM 9.4   Liver Function Tests:  Recent Labs Lab 09/15/13 1800  AST 115*  ALT 140*  ALKPHOS 544*  BILITOT 6.8*  PROT 7.5  ALBUMIN 3.0*    Recent Labs Lab 09/15/13 1800  LIPASE 56    Recent Labs Lab 09/15/13 1911  AMMONIA 29   CBC:  Recent Labs Lab 09/15/13 1800  WBC 10.6*  NEUTROABS 5.9  HGB 12.6*  HCT 38.4*  MCV 98.0  PLT 489*    Studies/Results: Ct Abdomen Pelvis Wo Contrast  09/15/2013   CLINICAL DATA:  Jaundice  EXAM: CT ABDOMEN AND PELVIS WITHOUT CONTRAST  TECHNIQUE: Multidetector CT imaging of the abdomen and pelvis was performed following the standard protocol without intravenous contrast.  COMPARISON:  None.  FINDINGS: BODY WALL: Fatty right inguinal hernia.  LOWER CHEST: Unremarkable.  ABDOMEN/PELVIS:  Liver: No focal abnormality.  Biliary: Intra and extrahepatic biliary dilatation, with the common bile duct measuring up to 23 mm diameter. The duct is dilated although the way to the papilla, were there is a ill-defined mass nearly filling the the duodenum, 5 cm in length by 2.3 cm in diameter. The posterior margin of the duodenal  C-loop has surrounding fat infiltration, possibly involving the proximal upper ureter given moderate right hydronephrosis. No definitive tip adenopathy.  Cholelithiasis.  Pancreas: Appears discrete from the above mass. No ductal dilatation.  Spleen: Unremarkable.  Adrenals: Unremarkable.  Kidneys and ureters: Moderate right hydronephrosis, likely from extrinsic ureteral compression, as above. There is a water density mass exophytic from the upper pole left kidney, approximately 2 cm. On the right, there is a water density mass exophytic from the interpolar region. There is a soft tissue  density mass exophytic from the lower pole right kidney, 2 cm in diameter.  Bladder: Unremarkable.  Reproductive: Unremarkable.  Bowel: Mid duodenal mass as above. Colonic diverticulosis. No bowel obstruction. Normal appendix.  Retroperitoneum: No mass or adenopathy.  Peritoneum: No free fluid or gas.  Vascular: No acute abnormality.  OSSEOUS: No acute abnormalities.  Right-sided L5 pars defect.  IMPRESSION: 1. High-grade biliary obstruction secondary to periampullary region mass, 5 cm in maximal dimension. Endoscopy would likely allow for tissue sampling. There is evidence of extension beyond the duodenum, with involvement of the proximal right ureter - causing moderate hydronephrosis. 2. Indeterminate 2 cm right renal mass, which could be a complicated cyst or solid neoplasm.   Electronically Signed   By: [name]   On: 09/15/2013 21:06    Medications:  Scheduled:  Continuous: . sodium chloride 75 mL/hr at 09/15/13 2342   PRN:  Assessment/Plan:  Principal Problem:   Biliary obstruction Active Problems:   Biliary obstruction due to malignant neoplasm   ARF (acute renal failure)   Hydronephrosis of right kidney   Cholestatic jaundice    Cholestatic Jaundice Secondary to Mass Probably due to duodenal/ampullary mass. Gi is following and plan is for ERCP for possible stent placement. Continue to trend LFT's. Patient will need EUS and biopsy as well in near future.  Possible Acute Renal Failure Recheck renal function today. Appreciate nephrology input. Continue IVF. Monitor UO.  Right Hydronephrosis Await Urology input. Patient may need ureteral stent.  History of Hypertension ACEI on hold for now. BP is reasonably well controlled.  Normocytic Anemia Check Anemia panel. Monitor Hgb.  Recheck electrolytes.  Code Status: Full Code  DVT Prophylaxis: Lovenox    Family Communication: Discussed with patient and wife  Disposition Plan: Not ready for discharge. Will go home  with wife when ready.    LOS: 1 day   [facility]  [facility] Pager [phone] 09/16/2013, 12:59 PM  If 8PM-8AM, please contact night-coverage at [email or web address], password [facility]

## 2013-09-16 NOTE — Op Note (Signed)
Left per bed at 1150 am back to room 313 in company of wife

## 2013-09-16 NOTE — Progress Notes (Signed)
EGD and ERCP rescheduled for am since patient received enoxaparin dose by mistake.

## 2013-09-16 NOTE — Progress Notes (Signed)
Patient ID: Trevor Crane, male   DOB: 03-May-1941, 72 y.o.   MRN: [id number] Please note, skin color is jaundiced, eyes icteric

## 2013-09-16 NOTE — Consult Note (Signed)
Note [phone]

## 2013-09-16 NOTE — Consult Note (Signed)
Reason for Consult: Acute kidney injury Referring Physician: Dr. Bari Mantis Chavira is an 72 y.o. male.  HPI: He is the patient who has previous history of hypertension, kidney stone presently was brought because of right flank pain and elevated liver function tests. According to the patient was having some problem with sinusitis and also urine tract infection. Patient was on Cipro. After that he start having dry mouth and some nausea. Patient with poor appetite but he doesn't have any diarrhea. He start having flank pain and because of that blood work was done which showed an increased in BUN and creatinine and also elevated liver function test. 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 Patient denies any difficulty breathing.  Past Medical History  Diagnosis Date  . Hypertension   . Hypercholesterolemia   . Panic attacks     History reviewed. No pertinent past surgical history.  No family history on file.  Social History:  reports that he has never smoked. He does not have any smokeless tobacco history on file. He reports that he drinks alcohol. He reports that he does not use illicit drugs.  Allergies:  Allergies  Allergen Reactions  . Sulfa Antibiotics     Medications: I have reviewed the patient's current medications.  Results for orders placed during the hospital encounter of 09/15/13 (from the past 48 hour(s))  CBC WITH DIFFERENTIAL     Status: Abnormal   Collection Time    09/15/13  6:00 PM      Result Value Range   WBC 10.6 (*) 4.0 - 10.5 K/uL   RBC 3.92 (*) 4.22 - 5.81 MIL/uL   Hemoglobin 12.6 (*) 13.0 - 17.0 g/dL   HCT 62.9 (*) 52.8 - 41.3 %   MCV 98.0  78.0 - 100.0 fL   MCH 32.1  26.0 - 34.0 pg   MCHC 32.8  30.0 - 36.0 g/dL   RDW 24.4 (*) 01.0 - 27.2 %   Platelets 489 (*) 150 - 400 K/uL   Neutrophils Relative % 56  43 - 77 %   Neutro Abs 5.9  1.7 - 7.7 K/uL   Lymphocytes Relative 34  12 - 46 %   Lymphs Abs 3.6  0.7 - 4.0 K/uL    Monocytes Relative 8  3 - 12 %   Monocytes Absolute 0.8  0.1 - 1.0 K/uL   Eosinophils Relative 3  0 - 5 %   Eosinophils Absolute 0.3  0.0 - 0.7 K/uL   Basophils Relative 0  0 - 1 %   Basophils Absolute 0.0  0.0 - 0.1 K/uL  COMPREHENSIVE METABOLIC PANEL     Status: Abnormal   Collection Time    09/15/13  6:00 PM      Result Value Range   Sodium 137  135 - 145 mEq/L   Potassium 3.4 (*) 3.5 - 5.1 mEq/L   Chloride 101  96 - 112 mEq/L   CO2 25  19 - 32 mEq/L   Glucose, Bld 102 (*) 70 - 99 mg/dL   BUN 24 (*) 6 - 23 mg/dL   Creatinine, Ser 5.36 (*) 0.50 - 1.35 mg/dL   Calcium 9.4  8.4 - 64.4 mg/dL   Total Protein 7.5  6.0 - 8.3 g/dL   Albumin 3.0 (*) 3.5 - 5.2 g/dL   AST 034 (*) 0 - 37 U/L   ALT 140 (*) 0 - 53 U/L   Alkaline Phosphatase 544 (*) 39 - 117 U/L  Total Bilirubin 6.8 (*) 0.3 - 1.2 mg/dL   GFR calc non Af Amer 29 (*) >90 mL/min   GFR calc Af Amer 34 (*) >90 mL/min   Comment: (NOTE)     The eGFR has been calculated using the CKD EPI equation.     This calculation has not been validated in all clinical situations.     eGFR's persistently <90 mL/min signify possible Chronic Kidney     Disease.  LIPASE, BLOOD     Status: None   Collection Time    09/15/13  6:00 PM      Result Value Range   Lipase 56  11 - 59 U/L  PROTIME-INR     Status: None   Collection Time    09/15/13  6:00 PM      Result Value Range   Prothrombin Time 13.3  11.6 - 15.2 seconds   INR 1.03  0.00 - 1.49  LACTIC ACID, PLASMA     Status: None   Collection Time    09/15/13  6:00 PM      Result Value Range   Lactic Acid, Venous 1.4  0.5 - 2.2 mmol/L  ACETAMINOPHEN LEVEL     Status: None   Collection Time    09/15/13  6:00 PM      Result Value Range   Acetaminophen (Tylenol), Serum <15.0  10 - 30 ug/mL   Comment:            THERAPEUTIC CONCENTRATIONS VARY     SIGNIFICANTLY. A RANGE OF 10-30     ug/mL MAY BE AN EFFECTIVE     CONCENTRATION FOR MANY PATIENTS.     HOWEVER, SOME ARE BEST TREATED      AT CONCENTRATIONS OUTSIDE THIS     RANGE.     ACETAMINOPHEN CONCENTRATIONS     >150 ug/mL AT 4 HOURS AFTER     INGESTION AND >50 ug/mL AT 12     HOURS AFTER INGESTION ARE     OFTEN ASSOCIATED WITH TOXIC     REACTIONS.  SALICYLATE LEVEL     Status: Abnormal   Collection Time    09/15/13  6:00 PM      Result Value Range   Salicylate Lvl <2.0 (*) 2.8 - 20.0 mg/dL  ETHANOL     Status: None   Collection Time    09/15/13  6:00 PM      Result Value Range   Alcohol, Ethyl (B) <11  0 - 11 mg/dL   Comment:            LOWEST DETECTABLE LIMIT FOR     SERUM ALCOHOL IS 11 mg/dL     FOR MEDICAL PURPOSES ONLY  AMMONIA     Status: None   Collection Time    09/15/13  7:11 PM      Result Value Range   Ammonia 29  11 - 60 umol/L  URINALYSIS, ROUTINE W REFLEX MICROSCOPIC     Status: Abnormal   Collection Time    09/15/13  7:58 PM      Result Value Range   Color, Urine YELLOW  YELLOW   APPearance CLEAR  CLEAR   Specific Gravity, Urine 1.020  1.005 - 1.030   pH 6.0  5.0 - 8.0   Glucose, UA NEGATIVE  NEGATIVE mg/dL   Hgb urine dipstick NEGATIVE  NEGATIVE   Bilirubin Urine MODERATE (*) NEGATIVE   Ketones, ur NEGATIVE  NEGATIVE mg/dL   Protein, ur NEGATIVE  NEGATIVE mg/dL  Urobilinogen, UA 1.0  0.0 - 1.0 mg/dL   Nitrite NEGATIVE  NEGATIVE   Leukocytes, UA NEGATIVE  NEGATIVE   Comment: MICROSCOPIC NOT DONE ON URINES WITH NEGATIVE PROTEIN, BLOOD, LEUKOCYTES, NITRITE, OR GLUCOSE <1000 mg/dL.    Ct Abdomen Pelvis Wo Contrast  09/15/2013   CLINICAL DATA:  Jaundice  EXAM: CT ABDOMEN AND PELVIS WITHOUT CONTRAST  TECHNIQUE: Multidetector CT imaging of the abdomen and pelvis was performed following the standard protocol without intravenous contrast.  COMPARISON:  None.  FINDINGS: BODY WALL: Fatty right inguinal hernia.  LOWER CHEST: Unremarkable.  ABDOMEN/PELVIS:  Liver: No focal abnormality.  Biliary: Intra and extrahepatic biliary dilatation, with the common bile duct measuring up to 23 mm diameter.  The duct is dilated although the way to the papilla, were there is a ill-defined mass nearly filling the the duodenum, 5 cm in length by 2.3 cm in diameter. The posterior margin of the duodenal C-loop has surrounding fat infiltration, possibly involving the proximal upper ureter given moderate right hydronephrosis. No definitive tip adenopathy.  Cholelithiasis.  Pancreas: Appears discrete from the above mass. No ductal dilatation.  Spleen: Unremarkable.  Adrenals: Unremarkable.  Kidneys and ureters: Moderate right hydronephrosis, likely from extrinsic ureteral compression, as above. There is a water density mass exophytic from the upper pole left kidney, approximately 2 cm. On the right, there is a water density mass exophytic from the interpolar region. There is a soft tissue density mass exophytic from the lower pole right kidney, 2 cm in diameter.  Bladder: Unremarkable.  Reproductive: Unremarkable.  Bowel: Mid duodenal mass as above. Colonic diverticulosis. No bowel obstruction. Normal appendix.  Retroperitoneum: No mass or adenopathy.  Peritoneum: No free fluid or gas.  Vascular: No acute abnormality.  OSSEOUS: No acute abnormalities.  Right-sided L5 pars defect.  IMPRESSION: 1. High-grade biliary obstruction secondary to periampullary region mass, 5 cm in maximal dimension. Endoscopy would likely allow for tissue sampling. There is evidence of extension beyond the duodenum, with involvement of the proximal right ureter - causing moderate hydronephrosis. 2. Indeterminate 2 cm right renal mass, which could be a complicated cyst or solid neoplasm.   Electronically Signed   By: [name]   On: 09/15/2013 21:06    Review of Systems  Constitutional: Negative for fever and chills.  Respiratory: Negative for shortness of breath.   Cardiovascular: Negative for orthopnea, claudication and leg swelling.  Gastrointestinal: Positive for nausea and abdominal pain. Negative for diarrhea.  Genitourinary:  Positive for frequency and flank pain.       Patient has problem of initiation of urination.   Blood pressure 139/83, pulse 83, temperature 97.6 F (36.4 C), temperature source Oral, resp. rate 20, height 5\' 11"  (1.803 m), weight 88.769 kg (195 lb 11.2 oz), SpO2 98.00%. Physical Exam  Assessment/Plan: Problem #1 renal failure: Possibly acute. The etiology for his renal failure could be ATN/prerenal/interstitial nephritis from Cipro/and right obstructive uropathy. Presently patient is none oliguric. Since patient has previous history of kidney stone underlying chronic kidney disease cannot ruled out. Problem #2 hypokalemia. Problem #3 right moderate or hydronephrosis. 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 This seems to be the external mass which is causing obstructive uropathy. However at this moment has refused cannot ruled out. Problem #4 elevated liver function. Patient denies any alcohol abuse in most likely secondary to the above described mass. Problem #5 history of kidney stone Problem #6 history of hypertension: Mild and patient  is not on any medications. Problem #7 history of hypercholesterolemia Problem #8 history of panic attack. Plan: We'll increase his IV fluid to 105 cc per hour We'll check fractional excretion of sodium and Hansel stain We'll check his basic metabolic panel in the morning. As this moment patient may benefit from urology consult.  Macil Crady S 09/16/2013, 9:06 AM

## 2013-09-16 NOTE — Care Management Note (Addendum)
    Page 1 of 1   09/17/2013     1:32:48 PM   CARE MANAGEMENT NOTE 09/17/2013  Patient:  Trevor Crane, Trevor Crane   Account Number:  [id number]  Date Initiated:  09/16/2013  Documentation initiated by:  Sharrie Rothman  Subjective/Objective Assessment:   Pt admitted from home with biliary obst. Pt lives with his wife and will return home at discharge. Pt is independent with ADL's.     Action/Plan:   No CM needs noted.   Anticipated DC Date:  09/18/2013   Anticipated DC Plan:  HOME/SELF CARE      DC Planning Services  CM consult      Choice offered to / List presented to:             Status of service:  Completed, signed off Medicare Important Message given?   (If response is "NO", the following Medicare IM given date fields will be blank) Date Medicare IM given:   Date Additional Medicare IM given:    Discharge Disposition:  ACUTE TO ACUTE TRANS  Per UR Regulation:    If discussed at Long Length of Stay Meetings, dates discussed:    Comments:  09/17/13 1330 Arlyss Queen, RN BSN CM Pt transferring to [facility]. No other CM needs noted.  09/16/13 1322 Arlyss Queen, RN BSN CM

## 2013-09-16 NOTE — Anesthesia Preprocedure Evaluation (Deleted)
Anesthesia Evaluation  Patient identified by MRN, date of birth, ID band Patient awake    Reviewed: Allergy & Precautions, H&P , NPO status , Patient's Chart, lab work & pertinent test results  Airway Mallampati: I TM Distance: >3 FB Neck ROM: Full    Dental  (+) Teeth Intact and Implants   Pulmonary neg pulmonary ROS,  breath sounds clear to auscultation        Cardiovascular hypertension, Pt. on medications Rhythm:Regular Rate:Normal     Neuro/Psych PSYCHIATRIC DISORDERS Anxiety    GI/Hepatic GERD-  Medicated and Controlled,  Endo/Other    Renal/GU      Musculoskeletal   Abdominal   Peds  Hematology   Anesthesia Other Findings   Reproductive/Obstetrics                           Anesthesia Physical Anesthesia Plan  ASA: II  Anesthesia Plan: General   Post-op Pain Management:    Induction: Intravenous, Rapid sequence and Cricoid pressure planned  Airway Management Planned: Oral ETT  Additional Equipment:   Intra-op Plan:   Post-operative Plan: Extubation in OR  Informed Consent: I have reviewed the patients History and Physical, chart, labs and discussed the procedure including the risks, benefits and alternatives for the proposed anesthesia with the patient or authorized representative who has indicated his/her understanding and acceptance.     Plan Discussed with:   Anesthesia Plan Comments:         Anesthesia Quick Evaluation  

## 2013-09-17 ENCOUNTER — Encounter (HOSPITAL_COMMUNITY)
Admission: EM | Disposition: A | Discharge status: Other Acute Inpatient Hospital | Payer: Self-pay | Source: Home / Self Care | Attending: Internal Medicine

## 2013-09-17 ENCOUNTER — Inpatient Hospital Stay (HOSPITAL_COMMUNITY): Payer: Medicare Other | Admitting: Anesthesiology

## 2013-09-17 ENCOUNTER — Encounter (HOSPITAL_COMMUNITY): Payer: Self-pay | Admitting: *Deleted

## 2013-09-17 ENCOUNTER — Encounter (HOSPITAL_COMMUNITY): Payer: Medicare Other | Admitting: Anesthesiology

## 2013-09-17 ENCOUNTER — Inpatient Hospital Stay (HOSPITAL_COMMUNITY): Payer: Medicare Other

## 2013-09-17 DIAGNOSIS — K315 Obstruction of duodenum: Secondary | ICD-10-CM

## 2013-09-17 DIAGNOSIS — K831 Obstruction of bile duct: Secondary | ICD-10-CM

## 2013-09-17 DIAGNOSIS — K869 Disease of pancreas, unspecified: Secondary | ICD-10-CM

## 2013-09-17 DIAGNOSIS — K319 Disease of stomach and duodenum, unspecified: Secondary | ICD-10-CM

## 2013-09-17 HISTORY — PX: ERCP: SHX5425

## 2013-09-17 HISTORY — PX: BIOPSY: SHX5522

## 2013-09-17 HISTORY — PX: ESOPHAGOGASTRODUODENOSCOPY (EGD) WITH PROPOFOL: SHX5813

## 2013-09-17 LAB — COMPREHENSIVE METABOLIC PANEL
ALT: 102 U/L — ABNORMAL HIGH (ref 0–53)
AST: 100 U/L — ABNORMAL HIGH (ref 0–37)
BUN: 19 mg/dL (ref 6–23)
CO2: 26 mEq/L (ref 19–32)
Calcium: 9.1 mg/dL (ref 8.4–10.5)
GFR calc Af Amer: 40 mL/min — ABNORMAL LOW (ref >90)
Glucose, Bld: 96 mg/dL (ref 70–99)
Sodium: 139 mEq/L (ref 135–145)
Total Protein: 6 g/dL (ref 6.0–8.3)

## 2013-09-17 LAB — CBC
HCT: 35.1 % — ABNORMAL LOW (ref 39.0–52.0)
Hemoglobin: 11.4 g/dL — ABNORMAL LOW (ref 13.0–17.0)
MCH: 31.8 pg (ref 26.0–34.0)
MCHC: 32.5 g/dL (ref 30.0–36.0)
WBC: 7.8 10*3/uL (ref 4.0–10.5)

## 2013-09-17 LAB — PHOSPHORUS: Phosphorus: 2.7 mg/dL (ref 2.3–4.6)

## 2013-09-17 SURGERY — ESOPHAGOGASTRODUODENOSCOPY (EGD) WITH PROPOFOL
Anesthesia: General

## 2013-09-17 MED ORDER — LISINOPRIL 5 MG PO TABS
5.0000 mg | ORAL_TABLET | Freq: Every day | ORAL | Status: DC
  Administered 2013-09-17: 5 mg via ORAL
  Filled 2013-09-17: qty 1

## 2013-09-17 MED ORDER — SUCCINYLCHOLINE CHLORIDE 20 MG/ML IJ SOLN
INTRAMUSCULAR | Status: DC | PRN
  Administered 2013-09-17: 120 mg via INTRAVENOUS

## 2013-09-17 MED ORDER — FENTANYL CITRATE 0.05 MG/ML IJ SOLN
INTRAMUSCULAR | Status: AC
  Filled 2013-09-17: qty 5

## 2013-09-17 MED ORDER — LIDOCAINE HCL 1 % IJ SOLN
INTRAMUSCULAR | Status: DC | PRN
  Administered 2013-09-17: 30 mg via INTRADERMAL

## 2013-09-17 MED ORDER — MIDAZOLAM HCL 2 MG/2ML IJ SOLN
INTRAMUSCULAR | Status: AC
  Filled 2013-09-17: qty 2

## 2013-09-17 MED ORDER — LACTATED RINGERS IV SOLN
INTRAVENOUS | Status: DC
  Administered 2013-09-17: 1000 mL via INTRAVENOUS

## 2013-09-17 MED ORDER — GLYCOPYRROLATE 0.2 MG/ML IJ SOLN
INTRAMUSCULAR | Status: DC | PRN
  Administered 2013-09-17: .6 mg via INTRAVENOUS

## 2013-09-17 MED ORDER — NEOSTIGMINE METHYLSULFATE 1 MG/ML IJ SOLN
INTRAMUSCULAR | Status: DC | PRN
  Administered 2013-09-17: 3 mg via INTRAVENOUS

## 2013-09-17 MED ORDER — GLYCOPYRROLATE 0.2 MG/ML IJ SOLN
0.2000 mg | Freq: Once | INTRAMUSCULAR | Status: AC
  Administered 2013-09-17: 0.2 mg via INTRAVENOUS

## 2013-09-17 MED ORDER — ONDANSETRON HCL 4 MG/2ML IJ SOLN
4.0000 mg | Freq: Once | INTRAMUSCULAR | Status: AC
  Administered 2013-09-17: 4 mg via INTRAVENOUS

## 2013-09-17 MED ORDER — ROCURONIUM BROMIDE 50 MG/5ML IV SOLN
INTRAVENOUS | Status: AC
  Filled 2013-09-17: qty 1

## 2013-09-17 MED ORDER — HEPARIN SODIUM (PORCINE) 5000 UNIT/ML IJ SOLN
5000.0000 [IU] | Freq: Three times a day (TID) | INTRAMUSCULAR | Status: DC
  Administered 2013-09-17: 5000 [IU] via SUBCUTANEOUS
  Filled 2013-09-17: qty 1

## 2013-09-17 MED ORDER — ROCURONIUM BROMIDE 100 MG/10ML IV SOLN
INTRAVENOUS | Status: DC | PRN
  Administered 2013-09-17: 5 mg via INTRAVENOUS

## 2013-09-17 MED ORDER — CEFAZOLIN SODIUM 1-5 GM-% IV SOLN
INTRAVENOUS | Status: AC
  Filled 2013-09-17: qty 100

## 2013-09-17 MED ORDER — SODIUM CHLORIDE 0.9 % IV SOLN
INTRAVENOUS | Status: AC
  Filled 2013-09-17: qty 50

## 2013-09-17 MED ORDER — ONDANSETRON HCL 4 MG/2ML IJ SOLN: 4.0000 mg | Freq: Once | INTRAMUSCULAR | Status: DC | PRN

## 2013-09-17 MED ORDER — FENTANYL CITRATE 0.05 MG/ML IJ SOLN: 25.0000 ug | INTRAMUSCULAR | Status: DC | PRN

## 2013-09-17 MED ORDER — SODIUM CHLORIDE 0.9 % IV SOLN
INTRAVENOUS | Status: DC | PRN
  Administered 2013-09-17: 13:00:00

## 2013-09-17 MED ORDER — GLYCOPYRROLATE 0.2 MG/ML IJ SOLN
INTRAMUSCULAR | Status: AC
  Filled 2013-09-17: qty 1

## 2013-09-17 MED ORDER — STERILE WATER FOR IRRIGATION IR SOLN
Status: DC | PRN
  Administered 2013-09-17: 13:00:00

## 2013-09-17 MED ORDER — PROPOFOL 10 MG/ML IV BOLUS
INTRAVENOUS | Status: AC
  Filled 2013-09-17: qty 20

## 2013-09-17 MED ORDER — PROPOFOL 10 MG/ML IV BOLUS
INTRAVENOUS | Status: DC | PRN
  Administered 2013-09-17: 150 mg via INTRAVENOUS

## 2013-09-17 MED ORDER — CEFAZOLIN SODIUM-DEXTROSE 2-3 GM-% IV SOLR
2.0000 g | INTRAVENOUS | Status: DC
Start: 2013-09-17 — End: 2013-09-17

## 2013-09-17 MED ORDER — ONDANSETRON HCL 4 MG/2ML IJ SOLN
INTRAMUSCULAR | Status: AC
  Filled 2013-09-17: qty 2

## 2013-09-17 MED ORDER — FLUOXETINE HCL 20 MG PO CAPS
20.0000 mg | ORAL_CAPSULE | Freq: Every day | ORAL | Status: DC
  Administered 2013-09-17: 20 mg via ORAL
  Filled 2013-09-17: qty 1

## 2013-09-17 MED ORDER — LORAZEPAM 1 MG PO TABS: 1.0000 mg | ORAL_TABLET | Freq: Three times a day (TID) | ORAL | Status: DC | PRN

## 2013-09-17 MED ORDER — PANTOPRAZOLE SODIUM 40 MG PO TBEC
40.0000 mg | DELAYED_RELEASE_TABLET | Freq: Every day | ORAL | Status: DC
  Administered 2013-09-17: 40 mg via ORAL
  Filled 2013-09-17: qty 1

## 2013-09-17 MED ORDER — GLYCOPYRROLATE 0.2 MG/ML IJ SOLN
INTRAMUSCULAR | Status: AC
  Filled 2013-09-17: qty 3

## 2013-09-17 MED ORDER — MIDAZOLAM HCL 2 MG/2ML IJ SOLN
1.0000 mg | INTRAMUSCULAR | Status: DC | PRN
  Administered 2013-09-17 (×2): 2 mg via INTRAVENOUS
  Filled 2013-09-17: qty 2

## 2013-09-17 MED ORDER — FENTANYL CITRATE 0.05 MG/ML IJ SOLN
INTRAMUSCULAR | Status: DC | PRN
  Administered 2013-09-17 (×2): 50 ug via INTRAVENOUS

## 2013-09-17 MED ORDER — SUCCINYLCHOLINE CHLORIDE 20 MG/ML IJ SOLN
INTRAMUSCULAR | Status: AC
  Filled 2013-09-17: qty 1

## 2013-09-17 MED ORDER — GLUCAGON HCL (RDNA) 1 MG IJ SOLR
INTRAMUSCULAR | Status: AC
  Filled 2013-09-17: qty 2

## 2013-09-17 MED ORDER — LIDOCAINE HCL (PF) 1 % IJ SOLN
INTRAMUSCULAR | Status: AC
  Filled 2013-09-17: qty 5

## 2013-09-17 SURGICAL SUPPLY — 42 items
BAG HAMPER (MISCELLANEOUS) ×2 IMPLANT
BALLN RETRIEVAL 12X15 (BALLOONS) IMPLANT
BASKET TRAPEZOID 3X6 (MISCELLANEOUS) IMPLANT
BLOCK BITE 60FR ADLT L/F BLUE (MISCELLANEOUS) ×2 IMPLANT
DEVICE INFLATION ENCORE 26 (MISCELLANEOUS) IMPLANT
DEVICE LOCKING W-BIOPSY CAP (MISCELLANEOUS) ×2 IMPLANT
ELECT REM PT RETURN 9FT ADLT (ELECTROSURGICAL) ×2
ELECTRODE REM PT RTRN 9FT ADLT (ELECTROSURGICAL) ×1 IMPLANT
FLOOR PAD 36X40 (MISCELLANEOUS) ×2
FORCEP COLD BIOPSY (CUTTING FORCEPS) IMPLANT
FORCEPS BIOP RAD 4 LRG CAP 4 (CUTTING FORCEPS) IMPLANT
FORCEPS BIOP RJ4 240 W/NDL (MISCELLANEOUS) ×2 IMPLANT
FORMALIN 10 PREFIL 20ML (MISCELLANEOUS) IMPLANT
GUIDEWIRE HYDRA JAGWIRE .35 (WIRE) IMPLANT
GUIDEWIRE JAG HINI 025X260CM (WIRE) IMPLANT
KIT CLEAN ENDO COMPLIANCE (KITS) ×2 IMPLANT
KIT ROOM TURNOVER APOR (KITS) ×2 IMPLANT
LUBRICANT JELLY 4.5OZ STERILE (MISCELLANEOUS) ×2 IMPLANT
MANIFOLD NEPTUNE II (INSTRUMENTS) ×2 IMPLANT
NEEDLE HYPO 18GX1.5 BLUNT FILL (NEEDLE) IMPLANT
NEEDLE SCLEROTHERAPY 25GX240 (NEEDLE) IMPLANT
PAD ARMBOARD 7.5X6 YLW CONV (MISCELLANEOUS) ×2 IMPLANT
PAD FLOOR 36X40 (MISCELLANEOUS) ×1 IMPLANT
PATHFINDER 450CM 0.18 (STENTS) IMPLANT
POSITIONER HEAD 8X9X4 ADT (SOFTGOODS) IMPLANT
PROBE APC STR FIRE (PROBE) IMPLANT
PROBE INJECTION GOLD (MISCELLANEOUS)
PROBE INJECTION GOLD 7FR (MISCELLANEOUS) IMPLANT
SNARE ROTATE MED OVAL 20MM (MISCELLANEOUS) IMPLANT
SNARE SHORT THROW 13M SML OVAL (MISCELLANEOUS) ×2 IMPLANT
SPHINCTEROTOME AUTOTOME .25 (MISCELLANEOUS) IMPLANT
SPHINCTEROTOME HYDRATOME 44 (MISCELLANEOUS) ×2 IMPLANT
SPONGE GAUZE 4X4 12PLY (GAUZE/BANDAGES/DRESSINGS) ×2 IMPLANT
SYR 3ML LL SCALE MARK (SYRINGE) IMPLANT
SYR 50ML LL SCALE MARK (SYRINGE) ×4 IMPLANT
SYR INFLATION 60ML (SYRINGE) ×2 IMPLANT
SYSTEM CONTINUOUS INJECTION (MISCELLANEOUS) ×2 IMPLANT
TUBING ENDO SMARTCAP PENTAX (MISCELLANEOUS) ×2 IMPLANT
TUBING IRRIGATION ENDOGATOR (MISCELLANEOUS) ×2 IMPLANT
WALLSTENT METAL COVERED 10X60 (STENTS) IMPLANT
WALLSTENT METAL COVERED 10X80 (STENTS) IMPLANT
WATER STERILE IRR 1000ML POUR (IV SOLUTION) ×4 IMPLANT

## 2013-09-17 NOTE — Anesthesia Postprocedure Evaluation (Signed)
  Anesthesia Post-op Note  Patient: Trevor Crane  Procedure(s) Performed: Procedure(s): ESOPHAGOGASTRODUODENOSCOPY (EGD) WITH PROPOFOL (N/A) ATTEMPTED ENDOSCOPIC RETROGRADE CHOLANGIOPANCREATOGRAPHY (ERCP) (N/A) BIOPSY  Patient Location: PACU  Anesthesia Type:General  Level of Consciousness: awake, alert  and oriented  Airway and Oxygen Therapy: Patient Spontanous Breathing  Post-op Pain: none  Post-op Assessment: Post-op Vital signs reviewed, Patient's Cardiovascular Status Stable, Respiratory Function Stable, Patent Airway and No signs of Nausea or vomiting  Post-op Vital Signs: Reviewed and stable  Complications: No apparent anesthesia complications

## 2013-09-17 NOTE — Progress Notes (Signed)
Subjective: Interval History: has no complaint of nausea or vomiting. Patient denies any difficulty in breathing.  Objective: Vital signs in last 24 hours: Temp:  [97.8 F (36.6 C)-98.9 F (37.2 C)] 98.2 F (36.8 C) (12/19 0442) Pulse Rate:  [71-92] 71 (12/19 0442) Resp:  [13-22] 18 (12/19 0442) BP: (115-145)/(62-86) 115/62 mmHg (12/19 0442) SpO2:  [95 %-99 %] 95 % (12/19 0442) Weight change:   Intake/Output from previous day: 12/18 0701 - 12/19 0700 In: 3048.3 [P.O.:480; I.V.:2568.3] Out: -  Intake/Output this shift:    General appearance: alert, cooperative and no distress Resp: clear to auscultation bilaterally Cardio: regular rate and rhythm, S1, S2 normal, no murmur, click, rub or gallop GI: full and mild tenderness. Extremities: edema trace to 1+ edema  Lab Results:  Recent Labs  09/15/13 1800 09/17/13 0632  WBC 10.6* 7.8  HGB 12.6* 11.4*  HCT 38.4* 35.1*  PLT 489* 416*   BMET:  Recent Labs  09/16/13 1308 09/17/13 0632  NA 138 139  K 3.9 4.3  CL 101 106  CO2 26 26  GLUCOSE 118* 96  BUN 21 19  CREATININE 1.92* 1.87*  CALCIUM 9.9 9.1   No results found for this basename: PTH,  in the last 72 hours Iron Studies: No results found for this basename: IRON, TIBC, TRANSFERRIN, FERRITIN,  in the last 72 hours  Studies/Results: Ct Abdomen Pelvis Wo Contrast  09/15/2013   CLINICAL DATA:  Jaundice  EXAM: CT ABDOMEN AND PELVIS WITHOUT CONTRAST  TECHNIQUE: Multidetector CT imaging of the abdomen and pelvis was performed following the standard protocol without intravenous contrast.  COMPARISON:  None.  FINDINGS: BODY WALL: Fatty right inguinal hernia.  LOWER CHEST: Unremarkable.  ABDOMEN/PELVIS:  Liver: No focal abnormality.  Biliary: Intra and extrahepatic biliary dilatation, with the common bile duct measuring up to 23 mm diameter. The duct is dilated although the way to the papilla, were there is a ill-defined mass nearly filling the the duodenum, 5 cm in length  by 2.3 cm in diameter. The posterior margin of the duodenal C-loop has surrounding fat infiltration, possibly involving the proximal upper ureter given moderate right hydronephrosis. No definitive tip adenopathy.  Cholelithiasis.  Pancreas: Appears discrete from the above mass. No ductal dilatation.  Spleen: Unremarkable.  Adrenals: Unremarkable.  Kidneys and ureters: Moderate right hydronephrosis, likely from extrinsic ureteral compression, as above. There is a water density mass exophytic from the upper pole left kidney, approximately 2 cm. On the right, there is a water density mass exophytic from the interpolar region. There is a soft tissue density mass exophytic from the lower pole right kidney, 2 cm in diameter.  Bladder: Unremarkable.  Reproductive: Unremarkable.  Bowel: Mid duodenal mass as above. Colonic diverticulosis. No bowel obstruction. Normal appendix.  Retroperitoneum: No mass or adenopathy.  Peritoneum: No free fluid or gas.  Vascular: No acute abnormality.  OSSEOUS: No acute abnormalities.  Right-sided L5 pars defect.  IMPRESSION: 1. High-grade biliary obstruction secondary to periampullary region mass, 5 cm in maximal dimension. Endoscopy would likely allow for tissue sampling. There is evidence of extension beyond the duodenum, with involvement of the proximal right ureter - causing moderate hydronephrosis. 2. Indeterminate 2 cm right renal mass, which could be a complicated cyst or solid neoplasm.   Electronically Signed   By: [name]   On: 09/15/2013 21:06    I have reviewed the patient's current medications.  Assessment/Plan: Problem #1 acute kidney injury: BUN and creatinine has this moment is improving.  Patient is none oliguric. Moderate right hydronephrosis. Patient presently has ultrasound but result is pending. Problem #3 hypertension his blood pressure is reasonably controlled Problem #4 possible biliary obstruction. Presently his LFTs showed slight  improvement. Problem #5 anemia: His hemoglobin hematocrit show slight decline. Problem #6 hypokalemia potassium has corrected.  Plan: Continue his hydration. We'll check his compressive metabolic panel in the morning.   LOS: 2 days   [name] 09/17/2013,8:17 AM

## 2013-09-17 NOTE — Progress Notes (Signed)
[facility]                                                                                Patient Demographics  Trevor Crane, is a 72 y.o. male, DOB - 05/16/1941, ZOX:[id number]  Admit date - 09/15/2013   Admitting Physician Meredeth Ide, MD  Outpatient Primary MD for the patient is ROBERTSON, Claude Manges, PA-C  LOS - 2   Chief Complaint  Patient presents with  . abnormal labs       Brief HPI: 72 year old male with no significant medical problems who came to the ED after patient had a routine blood test at the PCP office which showed abnormal liver function test and elevated creatinine. Patient was found to have elevated bilirubin in the urine so LFTs were done which were abnormal. Patient admits to having history of weight loss. In the ED CT scan of the abdominal was done which showed high-grade biliary obstruction secondary to peri-ampullary region mass 5 cm in dimension with evidence of extension beyond the duodenum with involvement of the proximal right ureter causing moderate hydronephrosis.     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 Being seen by GI, going for ERCP with possible biliary stent on 09/17/2013, he will eventually need an EUS done. LFT trending towards improvement.     Possible Acute Renal Failure  Recheck renal function today. Appreciate nephrology input. Continue IVF. Monitor UO.     Right Hydronephrosis on renal ultrasound  Await Urology input note is pending. Patient may need ureteral stent.     History of Hypertension  ACEI on hold for now. BP is reasonably well controlled.     Normocytic Anemia  Check Anemia panel. Monitor Hgb.        Code Status: full  Family Communication: wife  Disposition Plan: Home   Procedures CT scan abdomen pelvis, renal ultrasound, EGD with ERCP scheduled for 09/17/2013   Consults  GI, Urology   Medications  Scheduled Meds: .  [facility] HOLD]  ceFAZolin (ANCEF) IV  2 g Intravenous On Call to OR  . sodium chloride       Continuous Infusions: . sodium chloride 125 mL/hr at 09/17/13 0551  . lactated ringers 1,000 mL (09/17/13 1022)   PRN Meds:.midazolam  DVT Prophylaxis  heparin  Lab Results  Component Value Date   PLT 416* 09/17/2013    Antibiotics     Anti-infectives   Start     Dose/Rate Route Frequency Ordered Stop   09/17/13 0800  [MAR Hold]  ceFAZolin (ANCEF) IVPB 2 g/50 mL premix     (On MAR Hold since 09/17/13 1005)  Comments:  On-call to endo/OR   2 g 100 mL/hr over 30 Minutes Intravenous On call to O.R. 09/17/13 0745 09/18/13 0559   09/16/13 1015  ceFAZolin (ANCEF) IVPB 2 g/50 mL premix    Comments:  On-call to endo/OR   2 g 100 mL/hr over 30 Minutes Intravenous  Once 09/16/13 1012 09/16/13 1045          Subjective:   [name] Miss today has, No headache, No chest pain, No abdominal pain -  No Nausea, No new weakness tingling or numbness, No Cough - SOB.    Objective:   Filed Vitals:   09/16/13 1327 09/16/13 2038 09/17/13 0442 09/17/13 1015  BP: 136/76 139/86 115/62 124/73  Pulse: 92 75 71 67  Temp: 98.1 F (36.7 C) 98.9 F (37.2 C) 98.2 F (36.8 C) 98.2 F (36.8 C)  TempSrc: Oral Oral Oral Oral  Resp: 18 18 18 20   Height:      Weight:      SpO2: 99% 98% 95%     Wt Readings from Last 3 Encounters:  09/15/13 88.769 kg (195 lb 11.2 oz)  09/15/13 88.769 kg (195 lb 11.2 oz)  09/15/13 88.769 kg (195 lb 11.2 oz)     Intake/Output Summary (Last 24 hours) at 09/17/13 1036 Last data filed at 09/17/13 0500  Gross per 24 hour  Intake 3048.33 ml  Output      0 ml  Net 3048.33 ml    Exam Awake Alert, Oriented X 3, No new F.N deficits, Normal affect [place].AT,PERRAL, mild icterus Supple Neck,No JVD, No cervical lymphadenopathy appriciated.  Symmetrical Chest wall movement, Good air movement bilaterally, CTAB RRR,No Gallops,Rubs or new Murmurs, No Parasternal Heave +ve B.Sounds,  Abd Soft, Non tender, No organomegaly appriciated, No rebound - guarding or rigidity. No Cyanosis, Clubbing or edema, No new Rash or bruise      Data Review   Micro Results No results found for this or any previous visit (from the past 240 hour(s)).  Radiology Reports Ct Abdomen Pelvis Wo Contrast  09/15/2013   CLINICAL DATA:  Jaundice  EXAM: CT ABDOMEN AND PELVIS WITHOUT CONTRAST  TECHNIQUE: Multidetector CT imaging of the abdomen and pelvis was performed following the standard protocol without intravenous contrast.  COMPARISON:  None.  FINDINGS: BODY WALL: Fatty right inguinal hernia.  LOWER CHEST: Unremarkable.  ABDOMEN/PELVIS:  Liver: No focal abnormality.  Biliary: Intra and extrahepatic biliary dilatation, with the common bile duct measuring up to 23 mm diameter. The duct is dilated although the way to the papilla, were there is a ill-defined mass nearly filling the the duodenum, 5 cm in length by 2.3 cm in diameter. The posterior margin of the duodenal C-loop has surrounding fat infiltration, possibly involving the proximal upper ureter given moderate right hydronephrosis. No definitive tip adenopathy.  Cholelithiasis.  Pancreas: Appears discrete from the above mass. No ductal dilatation.  Spleen: Unremarkable.  Adrenals: Unremarkable.  Kidneys and ureters: Moderate right hydronephrosis, likely from extrinsic ureteral compression, as above. There is a water density mass exophytic from the upper pole left kidney, approximately 2 cm. On the right, there is a water density mass exophytic from the interpolar region. There is a soft tissue density mass exophytic from the lower pole right kidney, 2 cm in diameter.  Bladder: Unremarkable.  Reproductive: Unremarkable.  Bowel: Mid duodenal mass as above. Colonic diverticulosis. No bowel obstruction. Normal appendix.  Retroperitoneum: No mass or adenopathy.  Peritoneum: No free fluid or gas.  Vascular: No acute abnormality.  OSSEOUS: No acute abnormalities.   Right-sided L5 pars defect.  IMPRESSION: 1. High-grade biliary obstruction secondary to periampullary region mass, 5 cm in maximal dimension. Endoscopy would likely allow for tissue sampling. There is evidence of extension beyond the duodenum, with involvement of the proximal right ureter - causing moderate hydronephrosis. 2. Indeterminate 2 cm right renal mass, which could be a complicated cyst or solid neoplasm.   Electronically Signed   By: [name]   On: 09/15/2013 21:06  US Scrotum  08/24/2013   CLINICAL DATA:  Mass right testis on exam  EXAM: SCROTAL ULTRASOUND  DOPPLER ULTRASOUND OF THE TESTICLES  TECHNIQUE: Complete ultrasound examination of the testicles, epididymis, and other scrotal structures was performed. Color and spectral Doppler ultrasound were also utilized to evaluate blood flow to the testicles.  COMPARISON:  None  FINDINGS: Right testicle  Measurements: 3.8 x 2.7 x 3.2 cm. Normal echogenicity. No mass or calcification. Internal blood flow present on color Doppler imaging.  Left testicle  Measurements: 3.8 x 2.1 x 3.2 cm. Normal echogenicity. No mass or calcification. Internal blood flow present on color Doppler imaging.  Right epididymis:  Normal in size and appearance.  Left epididymis:  Normal in size and appearance.  Hydrocele: Bilateral small hydroceles present slightly larger on right.  Varicocele:  None identified  Pulsed Doppler interrogation of both testes demonstrates low resistance arterial and venous waveforms bilaterally.  IMPRESSION: Small bilateral hydroceles.  Otherwise negative exam.  Specifically no evidence of right testicular mass.   Electronically Signed   By: Ulyses Southward M.D.   On: 08/24/2013 11:25   US Renal  09/17/2013   CLINICAL DATA:  Hydronephrosis.  EXAM: RENAL/URINARY TRACT ULTRASOUND COMPLETE  COMPARISON:  CT 09/15/2013.  FINDINGS: Right Kidney:  Length: 10.4 cm. Echogenicity is normal. Mild hydronephrosis noted. A 1.2 cm simple cyst is noted  mid portion of the kidney corresponding to CT finding. Lower pole right kidney not well visualized. Previously identified possibly solid indeterminate lesion in lower pole right kidney not well visualized.  Left Kidney:  Length: 10.7 cm. Echogenicity within normal limits. No mass or hydronephrosis visualized.  Bladder:  Appears normal for degree of bladder distention. Right ureteral jet not identified.  IMPRESSION: 1. Mild right hydronephrosis. 2. Simple cyst midportion right kidney. Small indeterminate lesion noted in the inferior pole of the right kidney on CT not well identified.   Electronically Signed   By: Maisie Fus  Register   On: 09/17/2013 08:47   [place] Art/ven Flow Abd Pelv Doppler  08/24/2013   CLINICAL DATA:  Mass right testis on exam  EXAM: SCROTAL ULTRASOUND  DOPPLER ULTRASOUND OF THE TESTICLES  TECHNIQUE: Complete ultrasound examination of the testicles, epididymis, and other scrotal structures was performed. Color and spectral Doppler ultrasound were also utilized to evaluate blood flow to the testicles.  COMPARISON:  None  FINDINGS: Right testicle  Measurements: 3.8 x 2.7 x 3.2 cm. Normal echogenicity. No mass or calcification. Internal blood flow present on color Doppler imaging.  Left testicle  Measurements: 3.8 x 2.1 x 3.2 cm. Normal echogenicity. No mass or calcification. Internal blood flow present on color Doppler imaging.  Right epididymis:  Normal in size and appearance.  Left epididymis:  Normal in size and appearance.  Hydrocele: Bilateral small hydroceles present slightly larger on right.  Varicocele:  None identified  Pulsed Doppler interrogation of both testes demonstrates low resistance arterial and venous waveforms bilaterally.  IMPRESSION: Small bilateral hydroceles.  Otherwise negative exam.  Specifically no evidence of right testicular mass.   Electronically Signed   By: Ulyses Southward M.D.   On: 08/24/2013 11:25    CBC  Recent Labs Lab 09/15/13 1800 09/17/13 0632  WBC 10.6*  7.8  HGB 12.6* 11.4*  HCT 38.4* 35.1*  PLT 489* 416*  MCV 98.0 97.8  MCH 32.1 31.8  MCHC 32.8 32.5  RDW 16.4* 16.5*  LYMPHSABS 3.6  --   MONOABS 0.8  --   EOSABS 0.3  --  BASOSABS 0.0  --     Chemistries   Recent Labs Lab 09/15/13 1800 09/16/13 1308 09/17/13 0632  NA 137 138 139  K 3.4* 3.9 4.3  CL 101 101 106  CO2 25 26 26   GLUCOSE 102* 118* 96  BUN 24* 21 19  CREATININE 2.17* 1.92* 1.87*  CALCIUM 9.4 9.9 9.1  AST 115*  --  100*  ALT 140*  --  102*  ALKPHOS 544*  --  449*  BILITOT 6.8*  --  5.0*   ------------------------------------------------------------------------------------------------------------------ estimated creatinine clearance is 39.1 ml/min (by C-G formula based on Cr of 1.87). ------------------------------------------------------------------------------------------------------------------ No results found for this basename: HGBA1C,  in the last 72 hours ------------------------------------------------------------------------------------------------------------------ No results found for this basename: CHOL, HDL, LDLCALC, TRIG, CHOLHDL, LDLDIRECT,  in the last 72 hours ------------------------------------------------------------------------------------------------------------------ No results found for this basename: TSH, T4TOTAL, FREET3, T3FREE, THYROIDAB,  in the last 72 hours ------------------------------------------------------------------------------------------------------------------ No results found for this basename: VITAMINB12, FOLATE, FERRITIN, TIBC, IRON, RETICCTPCT,  in the last 72 hours  Coagulation profile  Recent Labs Lab 09/15/13 1800  INR 1.03    No results found for this basename: DDIMER,  in the last 72 hours  Cardiac Enzymes No results found for this basename: CK, CKMB, TROPONINI, MYOGLOBIN,  in the last 168  hours ------------------------------------------------------------------------------------------------------------------ No components found with this basename: POCBNP,      Time Spent in minutes  35   Bonney Berres K M.D on 09/17/2013 at 10:36 AM  Between 7am to 7pm - Pager - [phone]  After 7pm go to [email or web address] - password [email or web address]  And look for the night coverage person covering for me after hours  [facility] Office  [phone]

## 2013-09-17 NOTE — Progress Notes (Signed)
09/17/13 1537 Report given to Trevor Crane with [facility]. Pt in stable condition awaiting [facility] arrival for transport to [facility]. Earnstine Regal, RN

## 2013-09-17 NOTE — Progress Notes (Signed)
Lab results from this morning reviewed. Serum creatinine has dropped from 2.17 from admission to 1.87. Renal ultrasound shows mild right hydronephrosis and simple cyst in midportion right kidney. Small indeterminate lesion noted at the inferior pole the right kidney on CT but not well-seen on this study. Will proceed with EGD and ERCP.

## 2013-09-17 NOTE — Progress Notes (Signed)
09/17/13 1625 Patient left floor in stable condition via [facility] transport. No c/o pain or discomfort at time of transfer. Transferred to [facility] hospital. Earnstine Regal, RN

## 2013-09-17 NOTE — Anesthesia Preprocedure Evaluation (Signed)
Anesthesia Evaluation  Patient identified by MRN, date of birth, ID band Patient awake    Reviewed: Allergy & Precautions, H&P , NPO status , Patient's Chart, lab work & pertinent test results  Airway Mallampati: I TM Distance: >3 FB Neck ROM: Full    Dental  (+) Teeth Intact and Implants   Pulmonary neg pulmonary ROS,  breath sounds clear to auscultation        Cardiovascular hypertension, Pt. on medications Rhythm:Regular Rate:Normal     Neuro/Psych PSYCHIATRIC DISORDERS Anxiety    GI/Hepatic GERD-  Medicated and Controlled,  Endo/Other    Renal/GU      Musculoskeletal   Abdominal   Peds  Hematology   Anesthesia Other Findings   Reproductive/Obstetrics                           Anesthesia Physical Anesthesia Plan  ASA: II  Anesthesia Plan: General   Post-op Pain Management:    Induction: Intravenous, Rapid sequence and Cricoid pressure planned  Airway Management Planned: Oral ETT  Additional Equipment:   Intra-op Plan:   Post-operative Plan: Extubation in OR  Informed Consent: I have reviewed the patients History and Physical, chart, labs and discussed the procedure including the risks, benefits and alternatives for the proposed anesthesia with the patient or authorized representative who has indicated his/her understanding and acceptance.     Plan Discussed with:   Anesthesia Plan Comments:         Anesthesia Quick Evaluation

## 2013-09-17 NOTE — Discharge Summary (Signed)
Patient Demographics  Trevor Crane   ZOX:[id number]  WJX:[id number]  is a 72 y.o. male  DOB - 17-Mar-1941  09/15/2013  Transfer Date 09/17/2013  Admitting Physician Meredeth Ide, MD  Outpatient Primary MD for the patient is ROBERTSON, Claude Manges, PA-C   Place of Transfer to [facility] Accepting MD Dr. Rise Mu [facility] ambulance Condition stable  Admission Diagnosis  Renal failure [586] Biliary obstruction [576.2] Renal mass [593.9] Duodenal mass [537.89]  Discharge Diagnosis   pancreatic mass, right sided hydronephrosis, renal insufficiency unclear acute versus chronic  Past Medical History  Diagnosis Date  . Hypertension   . Hypercholesterolemia   . Panic attacks     History reviewed. No pertinent past surgical history.  Consults  GI, Urology,Renal  Significant Tests   Ct Abdomen Pelvis Wo Contrast  09/15/2013   CLINICAL DATA:  Jaundice  EXAM: CT ABDOMEN AND PELVIS WITHOUT CONTRAST  TECHNIQUE: Multidetector CT imaging of the abdomen and pelvis was performed following the standard protocol without intravenous contrast.  COMPARISON:  None.  FINDINGS: BODY WALL: Fatty right inguinal hernia.  LOWER CHEST: Unremarkable.  ABDOMEN/PELVIS:  Liver: No focal abnormality.  Biliary: Intra and extrahepatic biliary dilatation, with the common bile duct measuring up to 23 mm diameter. The duct is dilated although the way to the papilla, were there is a ill-defined mass nearly filling the the duodenum, 5 cm in length by 2.3 cm in diameter. The posterior margin of the duodenal C-loop has surrounding fat infiltration, possibly involving the proximal upper ureter given moderate right hydronephrosis. No definitive tip adenopathy.  Cholelithiasis.  Pancreas: Appears discrete from the above mass. No ductal dilatation.  Spleen: Unremarkable.  Adrenals: Unremarkable.  Kidneys and ureters: Moderate right hydronephrosis, likely from extrinsic ureteral compression, as above. There is  a water density mass exophytic from the upper pole left kidney, approximately 2 cm. On the right, there is a water density mass exophytic from the interpolar region. There is a soft tissue density mass exophytic from the lower pole right kidney, 2 cm in diameter.  Bladder: Unremarkable.  Reproductive: Unremarkable.  Bowel: Mid duodenal mass as above. Colonic diverticulosis. No bowel obstruction. Normal appendix.  Retroperitoneum: No mass or adenopathy.  Peritoneum: No free fluid or gas.  Vascular: No acute abnormality.  OSSEOUS: No acute abnormalities.  Right-sided L5 pars defect.  IMPRESSION: 1. High-grade biliary obstruction secondary to periampullary region mass, 5 cm in maximal dimension. Endoscopy would likely allow for tissue sampling. There is evidence of extension beyond the duodenum, with involvement of the proximal right ureter - causing moderate hydronephrosis. 2. Indeterminate 2 cm right renal mass, which could be a complicated cyst or solid neoplasm.   Electronically Signed   By: [name]   On: 09/15/2013 21:06   US Scrotum  08/24/2013   CLINICAL DATA:  Mass right testis on exam  EXAM: SCROTAL ULTRASOUND  DOPPLER ULTRASOUND OF THE TESTICLES  TECHNIQUE: Complete ultrasound examination of the testicles, epididymis, and other scrotal structures was performed. Color and spectral Doppler ultrasound were also utilized to evaluate blood flow to the testicles.  COMPARISON:  None  FINDINGS: Right testicle  Measurements: 3.8 x 2.7 x 3.2 cm. Normal echogenicity. No mass or calcification. Internal blood flow present on color Doppler imaging.  Left testicle  Measurements: 3.8 x 2.1 x 3.2 cm. Normal echogenicity. No mass or calcification. Internal blood flow present on color Doppler imaging.  Right epididymis:  Normal in size and appearance.  Left epididymis:  Normal  in size and appearance.  Hydrocele: Bilateral small hydroceles present slightly larger on right.  Varicocele:  None identified  Pulsed  Doppler interrogation of both testes demonstrates low resistance arterial and venous waveforms bilaterally.  IMPRESSION: Small bilateral hydroceles.  Otherwise negative exam.  Specifically no evidence of right testicular mass.   Electronically Signed   By: Ulyses Southward M.D.   On: 08/24/2013 11:25   EGD -    Hospital Course See H&P for all details in brief,this is a 72yr old Caucasian male with no significant medical problems except hypertension, who came to the ED after patient had a routine blood test at the PCP office which showed abnormal liver function test and elevated creatinine. Patient was found to have elevated bilirubin in the urine so LFTs were done which were abnormal. Patient admits to having history of weight loss. In the ED CT scan of the abdominal was done which showed high-grade biliary obstruction secondary to peri-ampullary region mass 5 cm in dimension with evidence of extension beyond the duodenum with involvement of the proximal right ureter causing moderate hydronephrosis.     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 with supportive care LFT trending towards improvement. He has been seen by GI physician Dr. Karilyn Cota and underwent an EGD findings below, subsequently the GI physician called to [facility] and discussed his case with Dr. Rise Mu who accepted aptient for further care and workup.  Normal mucosa of the esophagus.  Portal gastropathy.  2 small hyperplastic-appearing polyps in the antrum these were left alone.  Normal bulbar mucosa.  Large polyp filling the second part of the duodenum with luminal compromise.  Unable to advance due to scope past it.  Biopsy taken from this lesion and endoscope withdrawn.    Possible Acute Renal Failure  Recheck renal function today. Appreciate nephrology input. Continue IVF. Monitor I&O. Baseline creatinine unclear. Kindly involve both urology and renal in  consultation.   Right Hydronephrosis on renal ultrasound  Await Urology input note is pending. Patient may need ureteral stent.    History of Hypertension  ACEI on hold for now. BP is reasonably well controlled.    Normocytic Anemia  Check Anemia panel. Monitor Hgb.       Today   Subjective:   Trevor Crane today has no headache,no chest abdominal pain,no new weakness tingling or numbness.  Objective:   Blood pressure 121/62, pulse 73, temperature 98.2 F (36.8 C), temperature source Oral, resp. rate 18, height 5\' 11"  (1.803 m), weight 88.769 kg (195 lb 11.2 oz), SpO2 100.00%.  Intake/Output Summary (Last 24 hours) at 09/17/13 1337 Last data filed at 09/17/13 1330  Gross per 24 hour  Intake 4098.33 ml  Output      0 ml  Net 4098.33 ml    Exam Awake Alert, Oriented *3, No new F.N deficits, Normal affect [place].AT,PERRAL,+ve icterus  Supple Neck,No JVD, No cervical lymphadenopathy appriciated.  Symmetrical Chest wall movement, Good air movement bilaterally, CTAB RRR,No Gallops,Rubs or new Murmurs, No Parasternal Heave +ve B.Sounds, Abd Soft, Non tender, No organomegaly appriciated, No rebound -guarding or rigidity. No Cyanosis, Clubbing or edema, No new Rash or bruise  Data Review  CBC w Diff: Lab Results  Component Value Date   WBC 7.8 09/17/2013   HGB 11.4* 09/17/2013   HCT 35.1* 09/17/2013   PLT 416* 09/17/2013   LYMPHOPCT 34 09/15/2013   MONOPCT 8 09/15/2013   EOSPCT 3 09/15/2013   BASOPCT 0 09/15/2013  CMP: Lab Results  Component Value Date   NA 139 09/17/2013   K 4.3 09/17/2013   CL 106 09/17/2013   CO2 26 09/17/2013   BUN 19 09/17/2013   CREATININE 1.87* 09/17/2013   PROT 6.0 09/17/2013   ALBUMIN 2.5* 09/17/2013   BILITOT 5.0* 09/17/2013   ALKPHOS 449* 09/17/2013   AST 100* 09/17/2013   ALT 102* 09/17/2013  .  Prior to Admission medications   Medication Sig Start Date End Date Taking? Authorizing Provider  FLUoxetine (PROZAC) 20 MG  capsule Take 20 mg by mouth daily.   Yes Historical Provider, MD  lisinopril (PRINIVIL,ZESTRIL) 5 MG tablet Take 5 mg by mouth daily.   Yes Historical Provider, MD  LORazepam (ATIVAN) 1 MG tablet Take 1 mg by mouth 3 (three) times daily as needed for anxiety.   Yes Historical Provider, MD  omeprazole (PRILOSEC) 20 MG capsule Take 20 mg by mouth daily.   Yes Historical Provider, MD  simvastatin (ZOCOR) 40 MG tablet Take 40 mg by mouth at bedtime.   Yes Historical Provider, MD    . [facility] HOLD]  ceFAZolin (ANCEF) IV  2 g Intravenous On Call to OR  . heparin subcutaneous  5,000 Units Subcutaneous Q8H  . sodium chloride       . sodium chloride 125 mL/hr at 09/17/13 0551  . lactated ringers 1,000 mL (09/17/13 1022)     The risks and  benefits including possible death-disability of treating patient at this facility vs transporting the patinet to a higher level of care were discussed in detail with the patient and the plan was acceptable to them.   Total Time in preparing paper work, todays exam and data evaluation 35 minutes  Leroy Sea M.D on 09/17/2013 at 1:37 PM  [facility] Office  [phone]

## 2013-09-17 NOTE — Consult Note (Signed)
NAME:  Trevor Crane, PROSISE NO.:  [id number]  MEDICAL RECORD NO.:  [id number]  LOCATION:  A313                          FACILITY:  [facility]  PHYSICIAN:  Ky Barban, M.D.DATE OF BIRTH:  20-Jan-1941  DATE OF CONSULTATION: DATE OF DISCHARGE:                                CONSULTATION   CHIEF COMPLAINT:  Abnormal labs.  HISTORY OF PRESENT ILLNESS:  A 72 year old gentleman, no significant medical problems, came to the ED after patient had routine blood tests at primary care physician office showed abnormal liver function tests and elevated creatinine.  The patient says that he was diagnosed UTI in October and was prescribed antibiotics.  The patient's symptoms are right upper quadrant pain with nausea at that time which resolved after 3 days.  The patient was found to have elevated bilirubin in the urine, so LFTs were done which were abnormal.  The patient denies history of having heavy alcohol abuse, though he drinks 1 glass of wine occasionally at night.  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  So that is the reason I was called in to see because of right hydronephrosis and mass on the right kidney.  The patient has no urological symptoms.  CT which was done in the emergency room, abdominal CT showed high-grade biliary obstruction secondary to periampullary region mass, 5 cm in dimension with evidence of extension beyond the duodenum with external compression of the right upper ureter causing moderate hydronephrosis, there is also mass, one is a simple cyst, the other one is  indeterminate mass, it could be solid, so I was called in to see this patient.  LABORATORY DATA:  His labs shows that WBC count is 10.6, hematocrit is 38.4.  Sodium 137, potassium 3.4, chloride 101, CO2 is 25, BUN is 24, creatinine 2.17.  His urinalysis is negative for glucose, moderate bilirubin in the urine, ketone negative, negative nitrite, and negative leukocytes.  He does have gallstones on CT scan, but there is no stone in the common duct.  IMPRESSION:  So my impression is right hydronephrosis with renal failure, mass in the lower pole, right kidney could be complicated cyst. Recommend renal ultrasound.  The patient says that he wants to go home tomorrow afternoon but I told him that we will decide after looking at his ultrasound whether he needs a double-J stent because his renal functions may deteriorate further.  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     Ky Barban, M.D.     MIJ/MEDQ  D:  09/16/2013  T:  09/17/2013  Job:  [id number]

## 2013-09-17 NOTE — Transfer of Care (Signed)
Immediate Anesthesia Transfer of Care Note  Patient: Trevor Crane  Procedure(s) Performed: Procedure(s): ESOPHAGOGASTRODUODENOSCOPY (EGD) WITH PROPOFOL (N/A) ATTEMPTED ENDOSCOPIC RETROGRADE CHOLANGIOPANCREATOGRAPHY (ERCP) (N/A) BIOPSY  Patient Location: PACU  Anesthesia Type:General  Level of Consciousness: awake  Airway & Oxygen Therapy: Patient Spontanous Breathing and Patient connected to face mask oxygen  Post-op Assessment: Report given to PACU RN  Post vital signs: Reviewed and stable  Complications: No apparent anesthesia complications

## 2013-09-17 NOTE — Anesthesia Procedure Notes (Signed)
Procedure Name: Intubation Date/Time: 09/17/2013 12:11 PM Performed by: Glynn Octave E Pre-anesthesia Checklist: Patient identified, Patient being monitored, Timeout performed, Emergency Drugs available and Suction available Patient Re-evaluated:Patient Re-evaluated prior to inductionOxygen Delivery Method: Circle System Utilized Preoxygenation: Pre-oxygenation with 100% oxygen Intubation Type: IV induction, Rapid sequence and Cricoid Pressure applied Ventilation: Mask ventilation without difficulty Laryngoscope Size: Mac and 3 Grade View: Grade I Tube type: Oral Tube size: 7.0 mm Number of attempts: 1 Airway Equipment and Method: stylet Placement Confirmation: ETT inserted through vocal cords under direct vision,  positive ETCO2 and breath sounds checked- equal and bilateral Secured at: 21 cm Tube secured with: Tape Dental Injury: Teeth and Oropharynx as per pre-operative assessment

## 2013-09-17 NOTE — Progress Notes (Signed)
09/17/13 1455 Patient and wife requested to speak with Dr. Karilyn Cota regarding transfer to [facility]. Notified him of their request, states will speak with them.  Patient transferring to [facility] [employer]. Called report to Genene Churn, RN. Pt in stable condition awaiting transport for transfer. Earnstine Regal, RN

## 2013-09-17 NOTE — Progress Notes (Signed)
Pt ak\lready left i reviewed the chart he had renal u/s there is mild r hydro and onecyst in r kidney 2nd mass in lower pole r kidney could not be seen he will need mri with contrast to this indeterminate mass if it solid or complex cyst he was transferred to [facility] iwill tell his nurse to call and tell them about renal u/s report.

## 2013-09-17 NOTE — Brief Op Note (Signed)
09/15/2013 - 09/17/2013  1:09 PM  PATIENT:  Trevor Crane  72 y.o. male  PRE-OPERATIVE DIAGNOSIS:  temporary or duodenal mass  POST-OPERATIVE DIAGNOSIS:  portal gastropathy, gastric polyps,ampullary edenoma  PROCEDURE:  Procedure(s): ESOPHAGOGASTRODUODENOSCOPY (EGD) WITH PROPOFOL (N/A) ATTEMPTED ENDOSCOPIC RETROGRADE CHOLANGIOPANCREATOGRAPHY (ERCP) (N/A) BIOPSY  SURGEON:  Surgeon(s) and Role:    * Malissa Hippo, MD - Primary   Findings; Normal mucosa of the esophagus. Portal gastropathy. 2 small hyperplastic-appearing polyps in the antrum these were left alone. Normal bulbar mucosa. Large polyp filling the second part of the duodenum with luminal compromise. Unable to advance due to scope past it. Biopsy taken from this lesion and endoscope withdrawn.

## 2013-09-18 NOTE — Op Note (Signed)
NAME:  Trevor Crane, ALTSCHULER NO.:  [id number]  MEDICAL RECORD NO.:  [id number]  LOCATION:  A313                          FACILITY:  [facility]  PHYSICIAN:  Lionel December, M.D.    DATE OF BIRTH:  May 27, 1941  DATE OF PROCEDURE:  09/17/2013 DATE OF DISCHARGE:  09/17/2013                              OPERATIVE REPORT   PROCEDURE:  Esophagogastroduodenoscopy and attempted ERCP.  INDICATION:  The patient is a 72 year old Caucasian male, who presents with jaundice and he has dilated biliary system and a filling defect into duodenum, suspicious for ampullary lesion.  Procedure risks were reviewed with the patient.  Informed consent was obtained.  MEDS FOR SEDATION/ANESTHESIA:  Please see anesthesia records for details.  FINDINGS:  Procedure performed in the OR.  Once the patient was placed under general endotracheal anesthesia, he was turned into semiprone position.  Time-out was carried out.  PROCEDURE: 1. Esophagogastroduodenoscopy.  Pentax videoscope was passed through     oropharynx without any difficulty into esophagus. 2. Mucosa of the esophagus was normal.  GE junction was unremarkable. 3. Stomach.  It contained moderate amount of bile in it, but there was     no food debris.  Stomach distended very well with insufflation.     Folds in the proximal stomach are normal.  Examination of mucosa     revealed mosaic pattern consistent with mild portal gastropathy.     Two small hyperplastic appearing polyps are noted in the antrum and     these are left alone.  Pyloric channel was patent.  Angularis,     fundus, and cardia were also examined by retroflexion of scope and     were normal. 4. Duodenum.  Bulbar mucosa was normal.  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  Lumen was markedly narrowed.  I was not     able to pass the scope beyond it.  The top was palpated with a     sphincterotome  with papilla were not identified.  This scope was     withdrawn. 5. ERCP. 6. Duodenoscope was passed through oropharynx, esophagus, stomach and     across the pylorus and bulb and to descending duodenum.  It was not     able to advance the scope beyond this lesion.  Therefore, ERCP not     possible. 7. This scope was exchanged with a Q-scope and multiple biopsies taken     using the Jumbo forceps and endoscope was withdrawn.  The patient     tolerated the procedure well and he is in the process of being     extubated.  FINAL DIAGNOSES: 1. Mild portal gastropathy. 2. Two small hyperplastic appearing polyps at antrum which were left     alone. 3. 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 4. 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 5. Multiple biopsies taken.  RECOMMENDATIONS: 1. Clear liquids today.  2. Recommend patient be transferred to [facility]. 3. I have talked with Dr. Rise Mu, of Pancreatic Biliary     Surgical service at [facility] and he has kindly agreed to accept patient     in transfer. 4. The patient will need biliary decompression and he possibly also     will need EUS before surgical intervention.          ______________________________ Lionel December, M.D.     NR/MEDQ  D:  09/17/2013  T:  09/18/2013  Job:  [id number]

## 2013-09-18 NOTE — Anesthesia Postprocedure Evaluation (Signed)
  Anesthesia Post-op Note  Patient: Trevor Crane  Procedure(s) Performed: Procedure(s): ESOPHAGOGASTRODUODENOSCOPY (EGD) WITH PROPOFOL (N/A) ATTEMPTED ENDOSCOPIC RETROGRADE CHOLANGIOPANCREATOGRAPHY (ERCP) (N/A) BIOPSY (N/A)  Patient Location: 3a  Anesthesia Type:General  Level of Consciousness: awake, alert , oriented and patient cooperative  Airway and Oxygen Therapy: Patient Spontanous Breathing  Post-op Pain: none  Post-op Assessment: Post-op Vital signs reviewed, Patient's Cardiovascular Status Stable, Respiratory Function Stable, Patent Airway and Pain level controlled  Post-op Vital Signs: Reviewed and stable  Complications: No apparent anesthesia complications

## 2013-09-20 ENCOUNTER — Encounter (INDEPENDENT_AMBULATORY_CARE_PROVIDER_SITE_OTHER): Payer: Self-pay

## 2013-09-21 ENCOUNTER — Encounter (HOSPITAL_COMMUNITY): Payer: Self-pay | Admitting: Internal Medicine

## 2013-09-21 ENCOUNTER — Other Ambulatory Visit (HOSPITAL_COMMUNITY): Payer: Self-pay | Admitting: Hematology and Oncology

## 2013-09-28 ENCOUNTER — Encounter (HOSPITAL_COMMUNITY): Payer: Medicare Other | Attending: Hematology and Oncology

## 2013-09-28 ENCOUNTER — Encounter (HOSPITAL_COMMUNITY): Payer: Self-pay

## 2013-09-28 ENCOUNTER — Encounter (HOSPITAL_COMMUNITY): Payer: Medicare Other

## 2013-09-28 VITALS — BP 126/72 | HR 86 | Temp 97.9°F | Resp 18 | Ht 69.0 in | Wt 197.2 lb

## 2013-09-28 DIAGNOSIS — C170 Malignant neoplasm of duodenum: Secondary | ICD-10-CM | POA: Insufficient documentation

## 2013-09-28 DIAGNOSIS — K7689 Other specified diseases of liver: Secondary | ICD-10-CM

## 2013-09-28 DIAGNOSIS — K831 Obstruction of bile duct: Secondary | ICD-10-CM

## 2013-09-28 DIAGNOSIS — C241 Malignant neoplasm of ampulla of Vater: Secondary | ICD-10-CM

## 2013-09-28 HISTORY — DX: Malignant neoplasm of ampulla of Vater: C24.1

## 2013-09-28 LAB — CBC WITH DIFFERENTIAL/PLATELET
Basophils Absolute: 0 10*3/uL (ref 0.0–0.1)
Basophils Relative: 0 % (ref 0–1)
HCT: 37.2 % — ABNORMAL LOW (ref 39.0–52.0)
Lymphocytes Relative: 10 % — ABNORMAL LOW (ref 12–46)
Lymphs Abs: 1.7 10*3/uL (ref 0.7–4.0)
Neutro Abs: 14 10*3/uL — ABNORMAL HIGH (ref 1.7–7.7)
Neutrophils Relative %: 80 % — ABNORMAL HIGH (ref 43–77)
Platelets: 612 10*3/uL — ABNORMAL HIGH (ref 150–400)
RBC: 3.83 MIL/uL — ABNORMAL LOW (ref 4.22–5.81)
RDW: 15.5 % (ref 11.5–15.5)
WBC Morphology: INCREASED
WBC: 17.5 10*3/uL — ABNORMAL HIGH (ref 4.0–10.5)

## 2013-09-28 LAB — COMPREHENSIVE METABOLIC PANEL
ALT: 30 U/L (ref 0–53)
AST: 33 U/L (ref 0–37)
Albumin: 2.3 g/dL — ABNORMAL LOW (ref 3.5–5.2)
BUN: 39 mg/dL — ABNORMAL HIGH (ref 6–23)
CO2: 28 mEq/L (ref 19–32)
Calcium: 9.4 mg/dL (ref 8.4–10.5)
GFR calc Af Amer: 44 mL/min — ABNORMAL LOW (ref >90)
GFR calc non Af Amer: 38 mL/min — ABNORMAL LOW (ref >90)
Glucose, Bld: 131 mg/dL — ABNORMAL HIGH (ref 70–99)
Total Protein: 6.5 g/dL (ref 6.0–8.3)

## 2013-09-28 LAB — RETICULOCYTES
Retic Count, Absolute: 168.5 10*3/uL (ref 19.0–186.0)
Retic Ct Pct: 4.4 % — ABNORMAL HIGH (ref 0.4–3.1)

## 2013-09-28 MED ORDER — CAPECITABINE 500 MG PO TABS: ORAL_TABLET | ORAL | Status: DC

## 2013-09-28 NOTE — Progress Notes (Signed)
[facility] Earl Lites A. Zigmund Daniel, M.D.  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     HISTORY OF PRESENT ILLNESS:Trevor Crane is a 72 y.o. 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 He had his biliary tract catheter changed a larger bore tubes today in an effort to provide better drainage which has succeeded. His jaundice has improved. He denies any gross hematuria, nausea, or vomiting but does suffer with easy satiety. He did not present with intestinal obstruction. He denies any abdominal distention but does get bloated if he eats too much. He denies any cough, wheezing, fever, night sweats, or significant lower extremity swelling or redness. He has begun to lose weight. He feels weak and fatigued. He denies any PND, orthopnea, palpitations, but does have right-sided chest discomfort when he tries to take a deep breath.   PAST MEDICAL HISTORY:  has a past medical history of Hypertension; Hypercholesterolemia; Panic attacks; and Skin cancer.     PAST SURGICAL HISTORY: Past Surgical History  Procedure Laterality Date  . Esophagogastroduodenoscopy (egd) with propofol N/A 09/17/2013    Procedure: ESOPHAGOGASTRODUODENOSCOPY (EGD) WITH PROPOFOL;  Surgeon: Malissa Hippo, MD;  Location: [facility];  Service: Gastroenterology;  Laterality: N/A;  . Ercp N/A 09/17/2013    Procedure: ATTEMPTED ENDOSCOPIC RETROGRADE CHOLANGIOPANCREATOGRAPHY (ERCP);  Surgeon: Malissa Hippo, MD;   Location: [facility];  Service: Gastroenterology;  Laterality: N/A;  . Esophageal biopsy N/A 09/17/2013    Procedure: BIOPSY;  Surgeon: Malissa Hippo, MD;  Location: [facility];  Service: Gastroenterology;  Laterality: N/A;     CURRENT MEDICATIONS: has a current medication list which includes the following prescription(s): calcium carbonate, capecitabine, fluoxetine, lisinopril, loratadine-pseudoephedrine, lorazepam, omeprazole, ondansetron, oxycodone hcl (abuse deter), pantoprazole, and simvastatin.   ALLERGIES: Sulfa antibiotics   SOCIAL HISTORY:  reports that he has never smoked. He has quit using smokeless tobacco. He reports that he drinks about 0.6 ounces of alcohol per week. He reports that he does not use illicit drugs.   FAMILY HISTORY: family history includes Cancer in his mother; Hypertension in his father and maternal uncle.    REVIEW OF SYSTEMS:  Other than that discussed above is noncontributory.    PHYSICAL EXAM:  height is 5\' 9"  (1.753 m) and weight is 197 lb 3.2 oz (89.449 kg). His oral temperature is 97.9 F (36.6 C). His blood pressure is 126/72 and his pulse is 86. His respiration is 18.    GENERAL:alert, no distress and comfortable SKIN: skin color, texture, turgor are normal, no rashes or significant lesions EYES: normal, Conjunctiva are pink and non-injected, sclera minimally icteric OROPHARYNX:no exudate, no erythema and lips, buccal mucosa, and tongue normal  NECK: supple, thyroid normal size, non-tender, without nodularity CHEST: Normal AP diameter with no gynecomastia. LYMPH:  no palpable lymphadenopathy in the cervical, axillary or inguinal LUNGS: clear to auscultation . Decreased breath sounds on the right with dullness to percussion.  HEART: regular rate & rhythm and no murmurs ABDOMEN: Biliary drainage catheter in place with bile filling the reservoir bag. Tenderness in the right upper  quadrant with significant abdominal guarding with no free fluid wave or  shifting dullness. No right or left sided CVA tenderness. MUSCULOSKELETALl:no cyanosis of digits, no clubbing or edema  NEURO: alert & oriented x 3 with fluent speech, no focal motor/sensory deficits. No evidence of asterixis.    LABORATORY DATA:  Office Visit on 09/28/2013  Component Date Value Range Status  . WBC 09/28/2013 17.5* 4.0 - 10.5 K/uL Final  . RBC 09/28/2013 3.83* 4.22 - 5.81 MIL/uL Final  . Hemoglobin 09/28/2013 12.0* 13.0 - 17.0 g/dL Final  . HCT 16/07/9603 37.2* 39.0 - 52.0 % Final  . MCV 09/28/2013 97.1  78.0 - 100.0 fL Final  . MCH 09/28/2013 31.3  26.0 - 34.0 pg Final  . MCHC 09/28/2013 32.3  30.0 - 36.0 g/dL Final  . RDW 54/05/8118 15.5  11.5 - 15.5 % Final  . Platelets 09/28/2013 612* 150 - 400 K/uL Final  . Neutrophils Relative % 09/28/2013 80* 43 - 77 % Final  . Neutro Abs 09/28/2013 14.0* 1.7 - 7.7 K/uL Final  . Lymphocytes Relative 09/28/2013 10* 12 - 46 % Final  . Lymphs Abs 09/28/2013 1.7  0.7 - 4.0 K/uL Final  . Monocytes Relative 09/28/2013 9  3 - 12 % Final  . Monocytes Absolute 09/28/2013 1.6* 0.1 - 1.0 K/uL Final  . Eosinophils Relative 09/28/2013 1  0 - 5 % Final  . Eosinophils Absolute 09/28/2013 0.1  0.0 - 0.7 K/uL Final  . Basophils Relative 09/28/2013 0  0 - 1 % Final  . Basophils Absolute 09/28/2013 0.0  0.0 - 0.1 K/uL Final  . WBC Morphology 09/28/2013 INCREASED BANDS (>20% BANDS)   Corrected   ATYPICAL LYMPHOCYTES  . RBC Morphology 09/28/2013 POLYCHROMASIA PRESENT   Corrected  . Smear Review 09/28/2013 PLATELET COUNT CONFIRMED BY SMEAR   Corrected   Comment: PLATELETS APPEAR INCREASED                          LARGE PLATELETS PRESENT  . Retic Ct Pct 09/28/2013 4.4* 0.4 - 3.1 % Final  . RBC. 09/28/2013 3.83* 4.22 - 5.81 MIL/uL Final  . Retic Count, Manual 09/28/2013 168.5  19.0 - 186.0 K/uL Final  . Sodium 09/28/2013 136* 137 - 147 mEq/L Final   Please note change in reference range.  . Potassium 09/28/2013 4.7  3.7 - 5.3 mEq/L Final    Please note change in reference range.  . Chloride 09/28/2013 96  96 - 112 mEq/L Final  . CO2 09/28/2013 28  19 - 32 mEq/L Final  . Glucose, Bld 09/28/2013 131* 70 - 99 mg/dL Final  . BUN 14/78/2956 39* 6 - 23 mg/dL Final  . Creatinine, Ser 09/28/2013 1.74* 0.50 - 1.35 mg/dL Final  . Calcium 21/30/8657 9.4  8.4 - 10.5 mg/dL Final  . Total Protein 09/28/2013 6.5  6.0 - 8.3 g/dL Final  . Albumin 84/69/6295 2.3* 3.5 - 5.2 g/dL Final  . AST 28/41/3244 33  0 - 37 U/L Final  . ALT 09/28/2013 30  0 - 53 U/L Final  . Alkaline Phosphatase 09/28/2013 201* 39 - 117 U/L Final  . Total Bilirubin 09/28/2013 1.9* 0.3 - 1.2 mg/dL Final  . GFR calc non Af Amer 09/28/2013 38* >90 mL/min Final  . GFR calc Af Amer 09/28/2013 44* >90 mL/min Final   Comment: (NOTE)  The eGFR has been calculated using the CKD EPI equation.                          This calculation has not been validated in all clinical situations.                          eGFR's persistently <90 mL/min signify possible Chronic Kidney                          Disease.  Admission on 09/15/2013, Discharged on 09/17/2013  Component Date Value Range Status  . WBC 09/15/2013 10.6* 4.0 - 10.5 K/uL Final  . RBC 09/15/2013 3.92* 4.22 - 5.81 MIL/uL Final  . Hemoglobin 09/15/2013 12.6* 13.0 - 17.0 g/dL Final  . HCT 29/56/2130 38.4* 39.0 - 52.0 % Final  . MCV 09/15/2013 98.0  78.0 - 100.0 fL Final  . MCH 09/15/2013 32.1  26.0 - 34.0 pg Final  . MCHC 09/15/2013 32.8  30.0 - 36.0 g/dL Final  . RDW 86/57/8469 16.4* 11.5 - 15.5 % Final  . Platelets 09/15/2013 489* 150 - 400 K/uL Final  . Neutrophils Relative % 09/15/2013 56  43 - 77 % Final  . Neutro Abs 09/15/2013 5.9  1.7 - 7.7 K/uL Final  . Lymphocytes Relative 09/15/2013 34  12 - 46 % Final  . Lymphs Abs 09/15/2013 3.6  0.7 - 4.0 K/uL Final  . Monocytes Relative 09/15/2013 8  3 - 12 % Final  . Monocytes Absolute 09/15/2013 0.8  0.1 - 1.0 K/uL Final  . Eosinophils Relative  09/15/2013 3  0 - 5 % Final  . Eosinophils Absolute 09/15/2013 0.3  0.0 - 0.7 K/uL Final  . Basophils Relative 09/15/2013 0  0 - 1 % Final  . Basophils Absolute 09/15/2013 0.0  0.0 - 0.1 K/uL Final  . Sodium 09/15/2013 137  135 - 145 mEq/L Final  . Potassium 09/15/2013 3.4* 3.5 - 5.1 mEq/L Final  . Chloride 09/15/2013 101  96 - 112 mEq/L Final  . CO2 09/15/2013 25  19 - 32 mEq/L Final  . Glucose, Bld 09/15/2013 102* 70 - 99 mg/dL Final  . BUN 62/95/2841 24* 6 - 23 mg/dL Final  . Creatinine, Ser 09/15/2013 2.17* 0.50 - 1.35 mg/dL Final  . Calcium 32/44/0102 9.4  8.4 - 10.5 mg/dL Final  . Total Protein 09/15/2013 7.5  6.0 - 8.3 g/dL Final  . Albumin 72/53/6644 3.0* 3.5 - 5.2 g/dL Final  . AST 03/47/4259 115* 0 - 37 U/L Final  . ALT 09/15/2013 140* 0 - 53 U/L Final  . Alkaline Phosphatase 09/15/2013 544* 39 - 117 U/L Final  . Total Bilirubin 09/15/2013 6.8* 0.3 - 1.2 mg/dL Final  . GFR calc non Af Amer 09/15/2013 29* >90 mL/min Final  . GFR calc Af Amer 09/15/2013 34* >90 mL/min Final   Comment: (NOTE)                          The eGFR has been calculated using the CKD EPI equation.                          This calculation has not been validated in all clinical situations.  eGFR's persistently <90 mL/min signify possible Chronic Kidney                          Disease.  . Lipase 09/15/2013 56  11 - 59 U/L Final  . Color, Urine 09/15/2013 YELLOW  YELLOW Final  . APPearance 09/15/2013 CLEAR  CLEAR Final  . Specific Gravity, Urine 09/15/2013 1.020  1.005 - 1.030 Final  . pH 09/15/2013 6.0  5.0 - 8.0 Final  . Glucose, UA 09/15/2013 NEGATIVE  NEGATIVE mg/dL Final  . Hgb urine dipstick 09/15/2013 NEGATIVE  NEGATIVE Final  . Bilirubin Urine 09/15/2013 MODERATE* NEGATIVE Final  . Ketones, ur 09/15/2013 NEGATIVE  NEGATIVE mg/dL Final  . Protein, ur 40/98/1191 NEGATIVE  NEGATIVE mg/dL Final  . Urobilinogen, UA 09/15/2013 1.0  0.0 - 1.0 mg/dL Final  . Nitrite 47/82/9562  NEGATIVE  NEGATIVE Final  . Leukocytes, UA 09/15/2013 NEGATIVE  NEGATIVE Final   MICROSCOPIC NOT DONE ON URINES WITH NEGATIVE PROTEIN, BLOOD, LEUKOCYTES, NITRITE, OR GLUCOSE <1000 mg/dL.  Marland Kitchen Prothrombin Time 09/15/2013 13.3  11.6 - 15.2 seconds Final  . INR 09/15/2013 1.03  0.00 - 1.49 Final  . Ammonia 09/15/2013 29  11 - 60 umol/L Final  . Lactic Acid, Venous 09/15/2013 1.4  0.5 - 2.2 mmol/L Final  . Acetaminophen (Tylenol), Serum 09/15/2013 <15.0  10 - 30 ug/mL Final   Comment:                                 THERAPEUTIC CONCENTRATIONS VARY                          SIGNIFICANTLY. A RANGE OF 10-30                          ug/mL MAY BE AN EFFECTIVE                          CONCENTRATION FOR MANY PATIENTS.                          HOWEVER, SOME ARE BEST TREATED                          AT CONCENTRATIONS OUTSIDE THIS                          RANGE.                          ACETAMINOPHEN CONCENTRATIONS                          >150 ug/mL AT 4 HOURS AFTER                          INGESTION AND >50 ug/mL AT 12                          HOURS AFTER INGESTION ARE                          OFTEN ASSOCIATED WITH TOXIC  REACTIONS.  . Salicylate Lvl 09/15/2013 <2.0* 2.8 - 20.0 mg/dL Final  . Alcohol, Ethyl (B) 09/15/2013 <11  0 - 11 mg/dL Final   Comment:                                 LOWEST DETECTABLE LIMIT FOR                          SERUM ALCOHOL IS 11 mg/dL                          FOR MEDICAL PURPOSES ONLY  . Creatinine, Urine 09/16/2013 117.39   Final  . Sodium, Ur 09/16/2013 116   Final  . Color, Urine 09/16/2013 YELLOW  YELLOW Final  . APPearance 09/16/2013 CLEAR  CLEAR Final  . Specific Gravity, Urine 09/16/2013 1.020  1.005 - 1.030 Final  . pH 09/16/2013 6.0  5.0 - 8.0 Final  . Glucose, UA 09/16/2013 NEGATIVE  NEGATIVE mg/dL Final  . Hgb urine dipstick 09/16/2013 NEGATIVE  NEGATIVE Final  . Bilirubin Urine 09/16/2013 MODERATE* NEGATIVE Final  . Ketones, ur  09/16/2013 NEGATIVE  NEGATIVE mg/dL Final  . Protein, ur 16/07/9603 NEGATIVE  NEGATIVE mg/dL Final  . Urobilinogen, UA 09/16/2013 1.0  0.0 - 1.0 mg/dL Final  . Nitrite 54/05/8118 NEGATIVE  NEGATIVE Final  . Leukocytes, UA 09/16/2013 NEGATIVE  NEGATIVE Final   MICROSCOPIC NOT DONE ON URINES WITH NEGATIVE PROTEIN, BLOOD, LEUKOCYTES, NITRITE, OR GLUCOSE <1000 mg/dL.  Marland Kitchen Sodium 09/16/2013 138  135 - 145 mEq/L Final  . Potassium 09/16/2013 3.9  3.5 - 5.1 mEq/L Final  . Chloride 09/16/2013 101  96 - 112 mEq/L Final  . CO2 09/16/2013 26  19 - 32 mEq/L Final  . Glucose, Bld 09/16/2013 118* 70 - 99 mg/dL Final  . BUN 14/78/2956 21  6 - 23 mg/dL Final  . Creatinine, Ser 09/16/2013 1.92* 0.50 - 1.35 mg/dL Final  . Calcium 21/30/8657 9.9  8.4 - 10.5 mg/dL Final  . GFR calc non Af Amer 09/16/2013 34* >90 mL/min Final  . GFR calc Af Amer 09/16/2013 39* >90 mL/min Final   Comment: (NOTE)                          The eGFR has been calculated using the CKD EPI equation.                          This calculation has not been validated in all clinical situations.                          eGFR's persistently <90 mL/min signify possible Chronic Kidney                          Disease.  . Sodium 09/17/2013 139  135 - 145 mEq/L Final  . Potassium 09/17/2013 4.3  3.5 - 5.1 mEq/L Final  . Chloride 09/17/2013 106  96 - 112 mEq/L Final  . CO2 09/17/2013 26  19 - 32 mEq/L Final  . Glucose, Bld 09/17/2013 96  70 - 99 mg/dL Final  . BUN 84/69/6295 19  6 - 23 mg/dL Final  . Creatinine, Ser 09/17/2013 1.87* 0.50 - 1.35 mg/dL Final  . Calcium  09/17/2013 9.1  8.4 - 10.5 mg/dL Final  . Total Protein 09/17/2013 6.0  6.0 - 8.3 g/dL Final  . Albumin 96/29/5284 2.5* 3.5 - 5.2 g/dL Final  . AST 13/24/4010 100* 0 - 37 U/L Final  . ALT 09/17/2013 102* 0 - 53 U/L Final  . Alkaline Phosphatase 09/17/2013 449* 39 - 117 U/L Final  . Total Bilirubin 09/17/2013 5.0* 0.3 - 1.2 mg/dL Final  . GFR calc non Af Amer 09/17/2013 35* >90  mL/min Final  . GFR calc Af Amer 09/17/2013 40* >90 mL/min Final   Comment: (NOTE)                          The eGFR has been calculated using the CKD EPI equation.                          This calculation has not been validated in all clinical situations.                          eGFR's persistently <90 mL/min signify possible Chronic Kidney                          Disease.  . WBC 09/17/2013 7.8  4.0 - 10.5 K/uL Final  . RBC 09/17/2013 3.59* 4.22 - 5.81 MIL/uL Final  . Hemoglobin 09/17/2013 11.4* 13.0 - 17.0 g/dL Final  . HCT 27/25/3664 35.1* 39.0 - 52.0 % Final  . MCV 09/17/2013 97.8  78.0 - 100.0 fL Final  . MCH 09/17/2013 31.8  26.0 - 34.0 pg Final  . MCHC 09/17/2013 32.5  30.0 - 36.0 g/dL Final  . RDW 40/34/7425 16.5* 11.5 - 15.5 % Final  . Platelets 09/17/2013 416* 150 - 400 K/uL Final  . Phosphorus 09/17/2013 2.7  2.3 - 4.6 mg/dL Final    Urinalysis    Component Value Date/Time   COLORURINE YELLOW 09/16/2013 1951   APPEARANCEUR CLEAR 09/16/2013 1951   LABSPEC 1.020 09/16/2013 1951   PHURINE 6.0 09/16/2013 1951   GLUCOSEU NEGATIVE 09/16/2013 1951   HGBUR NEGATIVE 09/16/2013 1951   BILIRUBINUR MODERATE* 09/16/2013 1951   KETONESUR NEGATIVE 09/16/2013 1951   PROTEINUR NEGATIVE 09/16/2013 1951   UROBILINOGEN 1.0 09/16/2013 1951   NITRITE NEGATIVE 09/16/2013 1951   LEUKOCYTESUR NEGATIVE 09/16/2013 1951      @RADIOGRAPHY : Ct Abdomen Pelvis Wo Contrast  09/15/2013   CLINICAL DATA:  Jaundice  EXAM: CT ABDOMEN AND PELVIS WITHOUT CONTRAST  TECHNIQUE: Multidetector CT imaging of the abdomen and pelvis was performed following the standard protocol without intravenous contrast.  COMPARISON:  None.  FINDINGS: BODY WALL: Fatty right inguinal hernia.  LOWER CHEST: Unremarkable.  ABDOMEN/PELVIS:  Liver: No focal abnormality.  Biliary: Intra and extrahepatic biliary dilatation, with the common bile duct measuring up to 23 mm diameter. The duct is dilated although the way to the  papilla, were there is a ill-defined mass nearly filling the the duodenum, 5 cm in length by 2.3 cm in diameter. The posterior margin of the duodenal C-loop has surrounding fat infiltration, possibly involving the proximal upper ureter given moderate right hydronephrosis. No definitive tip adenopathy.  Cholelithiasis.  Pancreas: Appears discrete from the above mass. No ductal dilatation.  Spleen: Unremarkable.  Adrenals: Unremarkable.  Kidneys and ureters: Moderate right hydronephrosis, likely from extrinsic ureteral compression, as above. There is a water density mass exophytic from  the upper pole left kidney, approximately 2 cm. On the right, there is a water density mass exophytic from the interpolar region. There is a soft tissue density mass exophytic from the lower pole right kidney, 2 cm in diameter.  Bladder: Unremarkable.  Reproductive: Unremarkable.  Bowel: Mid duodenal mass as above. Colonic diverticulosis. No bowel obstruction. Normal appendix.  Retroperitoneum: No mass or adenopathy.  Peritoneum: No free fluid or gas.  Vascular: No acute abnormality.  OSSEOUS: No acute abnormalities.  Right-sided L5 pars defect.  IMPRESSION: 1. High-grade biliary obstruction secondary to periampullary region mass, 5 cm in maximal dimension. Endoscopy would likely allow for tissue sampling. There is evidence of extension beyond the duodenum, with involvement of the proximal right ureter - causing moderate hydronephrosis. 2. Indeterminate 2 cm right renal mass, which could be a complicated cyst or solid neoplasm.   Electronically Signed   By: [name]   On: 09/15/2013 21:06   US Renal  09/17/2013   CLINICAL DATA:  Hydronephrosis.  EXAM: RENAL/URINARY TRACT ULTRASOUND COMPLETE  COMPARISON:  CT 09/15/2013.  FINDINGS: Right Kidney:  Length: 10.4 cm. Echogenicity is normal. Mild hydronephrosis noted. A 1.2 cm simple cyst is noted mid portion of the kidney corresponding to CT finding. Lower pole right kidney  not well visualized. Previously identified possibly solid indeterminate lesion in lower pole right kidney not well visualized.  Left Kidney:  Length: 10.7 cm. Echogenicity within normal limits. No mass or hydronephrosis visualized.  Bladder:  Appears normal for degree of bladder distention. Right ureteral jet not identified.  IMPRESSION: 1. Mild right hydronephrosis. 2. Simple cyst midportion right kidney. Small indeterminate lesion noted in the inferior pole of the right kidney on CT not well identified.   Electronically Signed   By: Maisie Fus  Register   On: 09/17/2013 08:47   Dg C-arm 1-60 Min-no Report  09/17/2013   CLINICAL DATA: COMMON BILE DUCT OBSTRUCTION   C-ARM 1-60 MINUTES  Fluoroscopy was utilized by the requesting physician.  No radiographic  interpretation.     PATHOLOGY: for BERDELL, HOSTETLER ([id number]) Patient: LAMARKUS, NEBEL Collected: 09/17/2013 Client: [facility] Accession: [id number] Received: 09/17/2013 Lionel December DOB: [birth year] Age: 61 Gender: M Reported: 09/20/2013 [address] Patient Ph: [phone] MRN #: [id number] [place] [address] Visit #: [id number] Chart #: Phone: [phone] Fax: CC: REPORT OF SURGICAL PATHOLOGY FINAL DIAGNOSIS Diagnosis Ampulla of Vater, biopsy, polyp - INVASIVE ADENOCARCINOMA. Microscopic Comment The duodenal biopsy fragments are involved by invasive adenocarcinoma. . Dr. Berneice Heinrich agrees. Called to Dr. Karilyn Cota on 09/20/13. (JDP:caf 09/20/13) Jimmy Picket MD Pathologist, Electronic Signature (Case signed 09/20/2013) Specimen Gross and Clinical Information Specimen(s) Obtained: Ampulla of Vater, biopsy, polyp Specimen Clinical Information Pre-op: temporary or duodenal mass; Post-op: portal gastropathy, gastric polyps, ampullary adenoma Gross Received in formalin are tan, soft tissue fragments that are submitted in toto. Number: multiple, Size: 1.1 x 0.9 x 0.2 cm in aggregate. (KL:kh 09-17-13) Report signed out from the  following location(s) Technical Component performed at [facility]. [address],[place] [address].CLIA:[id number],CAP:[id number]., Interpretation performed at [facility]   IMPRESSION:  #1. Adenocarcinoma of the duodenum, status post percutaneous biliary drain with locally extensive disease resulting in right hydronephrosis, status post right ureteral stent.  #2. Hypertension, controlled. #3. History of basal cell carcinomas of the skin, status post resection. #4. Gastroesophageal reflux disease, on treatment. #5. Reactive leukocytosis with neutrophilia.   PLAN:  #1. Home health care followup of percutaneous biliary drain. #2. Life port insertion for  chemotherapy.  #3. Plan is to deliver neoadjuvant treatment utilizing Oxaliplatin plus Capecitabine administered as 2 week cycles and possibly adding Erbitux if his tumor is K-ras wild. #4. Formal chemotherapy teaching by nurse navigator. Introductory information about the 2 drugs was given to the patient's family today. #5. Plan is to initiate therapy on 10/11/2013 with office visit that day. Baseline labs were done today. Depending on tolerability and efficacy, 3-4 two-week cycles will be offered to begin with. Repeat CT scans to be done at [facility] per preference of Dr. Marilynn Rail. #6. Patient was encouraged to eat multiple small meals.  I appreciate the opportunity of sharing in his care.   Maurilio Lovely, MD 09/28/2013 4:13 PM

## 2013-09-28 NOTE — Progress Notes (Signed)
Trevor Crane presented for labwork. Labs per MD order drawn via Peripheral Line 23 gauge needle inserted in right AC  Good blood return present. Procedure without incident.  Needle removed intact. Patient tolerated procedure well.  Biliary drainage bag emptied per patient request and 270 cc bile removed.

## 2013-09-28 NOTE — Patient Instructions (Addendum)
[facility] Discharge Instructions  RECOMMENDATIONS MADE BY THE CONSULTANT AND ANY TEST RESULTS WILL BE SENT TO YOUR REFERRING PHYSICIAN.  Exam findings and plan of care discussed by Dr. Zigmund Daniel.   Plans are to get you scheduled for teaching with our nurse navigator - Rosana Berger.  She will contact you with a day and time.    We will also make a referral to home health for management of your biliary drainage tube and get you scheduled for port a cath placement by interventional radiology.  Interventional radiology will contact you with the day, time and location of the procedure.  Plans are to treat you with Oxaliplatin and Capcitabene.  You will get oxaliplatin every 14 days and the capcitabene you ill take 7days on and 7 days off.   SPECIAL INSTRUCTIONS/FOLLOW-UP: Will try to treat you on 10/11/13 as long as your port has been inserted.  You will also see Dr.  Zigmund Daniel that day as well.  Thank you for choosing [facility] to provide your oncology and hematology care.  To afford each patient quality time with our providers, please arrive at least 15 minutes before your scheduled appointment time.  With your help, our goal is to use those 15 minutes to complete the necessary work-up to ensure our physicians have the information they need to help with your evaluation and healthcare recommendations.    Effective January 1st, 2014, we ask that you re-schedule your appointment with our physicians should you arrive 10 or more minutes late for your appointment.  We strive to give you quality time with our providers, and arriving late affects you and other patients whose appointments are after yours.    Again, thank you for choosing [facility].  Our hope is that these requests will decrease the amount of time that you wait before being seen by our physicians.       _____________________________________________________________  Should you have questions  after your visit to [facility], please contact our office at [phone] between the hours of 8:30 a.m. and 5:00 p.m.  Voicemails left after 4:30 p.m. will not be returned until the following business day.  For prescription refill requests, have your pharmacy contact our office with your prescription refill request.    Capecitabine tablets What is this medicine? CAPECITABINE (ka pe SITE a been) is a chemotherapy drug. It slows the growth of cancer cells. This medicine is used to treat breast cancer, and also colon or rectal cancer. This medicine may be used for other purposes; ask your health care provider or pharmacist if you have questions. COMMON BRAND NAME(S): Xeloda What should I tell my health care provider before I take this medicine? They need to know if you have any of these conditions: -bleeding or blood disorders -dihydropyrimidine dehydrogenase (DPD) deficiency -heart disease -infection (especially a virus infection such as chickenpox, cold sores, or herpes) -kidney disease -liver disease -an unusual or allergic reaction to capecitabine, 5-fluorouracil, other medicines, foods, dyes, or preservatives -pregnant or trying to get pregnant -breast-feeding How should I use this medicine? Take this medicine by mouth with a glass of water, within 30 minutes of the end of a meal. Follow the directions on the prescription label. Take your medicine at regular intervals. Do not take it more often than directed. Do not stop taking except on your doctor's advice. Your doctor may want you to take a combination of 150 mg and 500 mg tablets for  each dose. It is very important that you know how to correctly take your dose. Taking the wrong tablets could result in an overdose (too much medication) or underdose (too little medication). Talk to your pediatrician regarding the use of this medicine in children. Special care may be needed. Overdosage: If you think you have taken too much  of this medicine contact a poison control center or emergency room at once. NOTE: This medicine is only for you. Do not share this medicine with others. What if I miss a dose? If you miss a dose, do not take the missed dose at all. Do not take double or extra doses. Instead, continue with your next scheduled dose and check with your doctor. What may interact with this medicine? -antacids with aluminum and/or magnesium -folic acid -leucovorin -medicines to increase blood counts like filgrastim, pegfilgrastim, sargramostim -phenytoin -vaccines -warfarin Talk to your doctor or health care professional before taking any of these medicines: -acetaminophen -aspirin -ibuprofen -ketoprofen -naproxen This list may not describe all possible interactions. Give your health care provider a list of all the medicines, herbs, non-prescription drugs, or dietary supplements you use. Also tell them if you smoke, drink alcohol, or use illegal drugs. Some items may interact with your medicine. What should I watch for while using this medicine? Visit your doctor for checks on your progress. This drug may make you feel generally unwell. This is not uncommon, as chemotherapy can affect healthy cells as well as cancer cells. Report any side effects. Continue your course of treatment even though you feel ill unless your doctor tells you to stop. In some cases, you may be given additional medicines to help with side effects. Follow all directions for their use. Call your doctor or health care professional for advice if you get a fever, chills or sore throat, or other symptoms of a cold or flu. Do not treat yourself. This drug decreases your body's ability to fight infections. Try to avoid being around people who are sick. This medicine may increase your risk to bruise or bleed. Call your doctor or health care professional if you notice any unusual bleeding. Be careful brushing and flossing your teeth or using a  toothpick because you may get an infection or bleed more easily. If you have any dental work done, tell your dentist you are receiving this medicine. Avoid taking products that contain aspirin, acetaminophen, ibuprofen, naproxen, or ketoprofen unless instructed by your doctor. These medicines may hide a fever. Do not become pregnant while taking this medicine. Women should inform their doctor if they wish to become pregnant or think they might be pregnant. There is a potential for serious side effects to an unborn child. Talk to your health care professional or pharmacist for more information. Do not breast-feed an infant while taking this medicine. Men are advised not to father a child while taking this medicine. What side effects may I notice from receiving this medicine? Side effects that you should report to your doctor or health care professional as soon as possible: -allergic reactions like skin rash, itching or hives, swelling of the face, lips, or tongue -low blood counts - this medicine may decrease the number of white blood cells, red blood cells and platelets. You may be at increased risk for infections and bleeding. -signs of infection - fever or chills, cough, sore throat, pain or difficulty passing urine -signs of decreased platelets or bleeding - bruising, pinpoint red spots on the skin, black, tarry stools, blood in  the urine -signs of decreased red blood cells - unusually weak or tired, fainting spells, lightheadedness -breathing problems -changes in vision -chest pain -diarrhea of more than 4 bowel movements in one day or any diarrhea at night -mouth sores -nausea and vomiting -pain, swelling, redness at site where injected -pain, tingling, numbness in the hands or feet -redness, swelling, or sores on hands or feet -stomach pain -vomiting -yellow color of skin or eyes Side effects that usually do not require medical attention (report to your doctor or health care professional  if they continue or are bothersome): -constipation -diarrhea -dry or itchy skin -hair loss -loss of appetite -nausea -weak or tired This list may not describe all possible side effects. Call your doctor for medical advice about side effects. You may report side effects to FDA at 1-800-FDA-1088. Where should I keep my medicine? Keep out of the reach of children. Store at room temperature between 15 and 30 degrees C (59 and 86 degrees F). Keep container tightly closed. Throw away any unused medicine after the expiration date. NOTE: This sheet is a summary. It may not cover all possible information. If you have questions about this medicine, talk to your doctor, pharmacist, or health care provider.  2014, Elsevier/Gold Standard. (2008-08-29 11:47:41) Oxaliplatin Injection What is this medicine? OXALIPLATIN (ox AL i PLA tin) is a chemotherapy drug. It targets fast dividing cells, like cancer cells, and causes these cells to die. This medicine is used to treat cancers of the colon and rectum, and many other cancers. This medicine may be used for other purposes; ask your health care provider or pharmacist if you have questions. COMMON BRAND NAME(S): Eloxatin What should I tell my health care provider before I take this medicine? They need to know if you have any of these conditions: -kidney disease -an unusual or allergic reaction to oxaliplatin, other chemotherapy, other medicines, foods, dyes, or preservatives -pregnant or trying to get pregnant -breast-feeding How should I use this medicine? This drug is given as an infusion into a vein. It is administered in a hospital or clinic by a specially trained health care professional. Talk to your pediatrician regarding the use of this medicine in children. Special care may be needed. Overdosage: If you think you have taken too much of this medicine contact a poison control center or emergency room at once. NOTE: This medicine is only for you. Do  not share this medicine with others. What if I miss a dose? It is important not to miss a dose. Call your doctor or health care professional if you are unable to keep an appointment. What may interact with this medicine? -medicines to increase blood counts like filgrastim, pegfilgrastim, sargramostim -probenecid -some antibiotics like amikacin, gentamicin, neomycin, polymyxin B, streptomycin, tobramycin -zalcitabine Talk to your doctor or health care professional before taking any of these medicines: -acetaminophen -aspirin -ibuprofen -ketoprofen -naproxen This list may not describe all possible interactions. Give your health care provider a list of all the medicines, herbs, non-prescription drugs, or dietary supplements you use. Also tell them if you smoke, drink alcohol, or use illegal drugs. Some items may interact with your medicine. What should I watch for while using this medicine? Your condition will be monitored carefully while you are receiving this medicine. You will need important blood work done while you are taking this medicine. This medicine can make you more sensitive to cold. Do not drink cold drinks or use ice. Cover exposed skin before coming in contact with cold  temperatures or cold objects. When out in cold weather wear warm clothing and cover your mouth and nose to warm the air that goes into your lungs. Tell your doctor if you get sensitive to the cold. This drug may make you feel generally unwell. This is not uncommon, as chemotherapy can affect healthy cells as well as cancer cells. Report any side effects. Continue your course of treatment even though you feel ill unless your doctor tells you to stop. In some cases, you may be given additional medicines to help with side effects. Follow all directions for their use. Call your doctor or health care professional for advice if you get a fever, chills or sore throat, or other symptoms of a cold or flu. Do not treat yourself.  This drug decreases your body's ability to fight infections. Try to avoid being around people who are sick. This medicine may increase your risk to bruise or bleed. Call your doctor or health care professional if you notice any unusual bleeding. Be careful brushing and flossing your teeth or using a toothpick because you may get an infection or bleed more easily. If you have any dental work done, tell your dentist you are receiving this medicine. Avoid taking products that contain aspirin, acetaminophen, ibuprofen, naproxen, or ketoprofen unless instructed by your doctor. These medicines may hide a fever. Do not become pregnant while taking this medicine. Women should inform their doctor if they wish to become pregnant or think they might be pregnant. There is a potential for serious side effects to an unborn child. Talk to your health care professional or pharmacist for more information. Do not breast-feed an infant while taking this medicine. Call your doctor or health care professional if you get diarrhea. Do not treat yourself. What side effects may I notice from receiving this medicine? Side effects that you should report to your doctor or health care professional as soon as possible: -allergic reactions like skin rash, itching or hives, swelling of the face, lips, or tongue -low blood counts - This drug may decrease the number of white blood cells, red blood cells and platelets. You may be at increased risk for infections and bleeding. -signs of infection - fever or chills, cough, sore throat, pain or difficulty passing urine -signs of decreased platelets or bleeding - bruising, pinpoint red spots on the skin, black, tarry stools, nosebleeds -signs of decreased red blood cells - unusually weak or tired, fainting spells, lightheadedness -breathing problems -chest pain, pressure -cough -diarrhea -jaw tightness -mouth sores -nausea and vomiting -pain, swelling, redness or irritation at the  injection site -pain, tingling, numbness in the hands or feet -problems with balance, talking, walking -redness, blistering, peeling or loosening of the skin, including inside the mouth -trouble passing urine or change in the amount of urine Side effects that usually do not require medical attention (report to your doctor or health care professional if they continue or are bothersome): -changes in vision -constipation -hair loss -loss of appetite -metallic taste in the mouth or changes in taste -stomach pain This list may not describe all possible side effects. Call your doctor for medical advice about side effects. You may report side effects to FDA at 1-800-FDA-1088. Where should I keep my medicine? This drug is given in a hospital or clinic and will not be stored at home. NOTE: This sheet is a summary. It may not cover all possible information. If you have questions about this medicine, talk to your doctor, pharmacist, or health care  provider.  2014, [employer]/Gold Standard. (2008-04-12 17:22:47) Implanted Port Instructions An implanted port is a central line that has a round shape and is placed under the skin. It is used for long-term IV (intravenous) access for:  Medicine.  Fluids.  Liquid nutrition, such as TPN (total parenteral nutrition).  Blood samples. Ports can be placed:  In the chest area just below the collarbone (this is the most common place.)  In the arms.  In the belly (abdomen) area.  In the legs. PARTS OF THE PORT A port has 2 main parts:  The reservoir. The reservoir is round, disc-shaped, and will be a small, raised area under your skin.  The reservoir is the part where a needle is inserted (accessed) to either give medicines or to draw blood.  The catheter. The catheter is a long, slender tube that extends from the reservoir. The catheter is placed into a large vein.  Medicine that is inserted into the reservoir goes into the catheter and then into  the vein. INSERTION OF THE PORT  The port is surgically placed in either an operating room or in a procedural area (interventional radiology).  Medicine may be given to help you relax during the procedure.  The skin where the port will be inserted is numbed (local anesthetic).  1 or 2 small cuts (incisions) will be made in the skin to insert the port.  The port can be used after it has been inserted. INCISION SITE CARE  The incision site may have small adhesive strips on it. This helps keep the incision site closed. Sometimes, no adhesive strips are placed. Instead of adhesive strips, a special kind of surgical glue is used to keep the incision closed.  If adhesive strips were placed on the incision sites, do not take them off. They will fall off on their own.  The incision site may be sore for 1 to 2 days. Pain medicine can help.  Do not get the incision site wet. Bathe or shower as directed by your caregiver.  The incision site should heal in 5 to 7 days. A small scar may form after the incision has healed. ACCESSING THE PORT Special steps must be taken to access the port:  Before the port is accessed, a numbing cream can be placed on the skin. This helps numb the skin over the port site.  A sterile technique is used to access the port.  The port is accessed with a needle. Only "non-coring" port needles should be used to access the port. Once the port is accessed, a blood return should be checked. This helps ensure the port is in the vein and is not clogged (clotted).  If your caregiver believes your port should remain accessed, a clear (transparent) bandage will be placed over the needle site. The bandage and needle will need to be changed every week or as directed by your caregiver.  Keep the bandage covering the needle clean and dry. Do not get it wet. Follow your caregiver's instructions on how to take a shower or bath when the port is accessed.  If your port does not need  to stay accessed, no bandage is needed over the port. FLUSHING THE PORT Flushing the port keeps it from getting clogged. How often the port is flushed depends on:  If a constant infusion is running. If a constant infusion is running, the port may not need to be flushed.  If intermittent medicines are given.  If the port is not being  used. For intermittent medicines:  The port will need to be flushed:  After medicines have been given.  After blood has been drawn.  As part of routine maintenance.  A port is normally flushed with:  Normal saline.  Heparin.  Follow your caregiver's advice on how often, how much, and the type of flush to use on your port. IMPORTANT PORT INFORMATION  Tell your caregiver if you are allergic to heparin.  After your port is placed, you will get a manufacturer's information card. The card has information about your port. Keep this card with you at all times.  There are many types of ports available. Know what kind of port you have.  In case of an emergency, it may be helpful to wear a medical alert bracelet. This can help alert health care workers that you have a port.  The port can stay in for as long as your caregiver believes it is necessary.  When it is time for the port to come out, surgery will be done to remove it. The surgery will be similar to how the port was put in.  If you are in the hospital or clinic:  Your port will be taken care of and flushed by a nurse.  If you are at home:  A home health care nurse may give medicines and take care of the port.  You or a family member can get special training and directions for giving medicine and taking care of the port at home. SEEK IMMEDIATE MEDICAL CARE IF:   Your port does not flush or you are unable to get a blood return.  New drainage or pus is coming from the incision.  A bad smell is coming from the incision site.  You develop swelling or increased redness at the incision  site.  You develop increased swelling or pain at the port site.  You develop swelling or pain in the surrounding skin near the port.  You have an oral temperature above 102 F (38.9 C), not controlled by medicine. MAKE SURE YOU:   Understand these instructions.  Will watch your condition.  Will get help right away if you are not doing well or get worse. Document Released: 09/16/2005 Document Revised: 12/09/2011 Document Reviewed: 12/08/2008 [facility] Patient Information 2014 [place], [place].

## 2013-09-29 ENCOUNTER — Other Ambulatory Visit: Payer: Self-pay | Admitting: Radiology

## 2013-09-29 ENCOUNTER — Encounter (HOSPITAL_COMMUNITY): Payer: Self-pay | Admitting: Pharmacy Technician

## 2013-09-29 LAB — CEA: CEA: 1.6 ng/mL (ref 0.0–5.0)

## 2013-09-29 LAB — CANCER ANTIGEN 19-9: CA 19-9: 646.3 U/mL — ABNORMAL HIGH (ref <35.0)

## 2013-10-01 ENCOUNTER — Other Ambulatory Visit (HOSPITAL_COMMUNITY): Payer: Medicare Other

## 2013-10-01 ENCOUNTER — Other Ambulatory Visit (HOSPITAL_COMMUNITY): Payer: Self-pay | Admitting: Hematology and Oncology

## 2013-10-01 ENCOUNTER — Ambulatory Visit (HOSPITAL_COMMUNITY)
Admission: RE | Admit: 2013-10-01 | Discharge: 2013-10-01 | Disposition: A | Discharge status: Home / Self Care | Payer: Medicare Other | Source: Ambulatory Visit | Attending: Hematology and Oncology | Admitting: Hematology and Oncology

## 2013-10-01 ENCOUNTER — Encounter (HOSPITAL_COMMUNITY): Payer: Self-pay

## 2013-10-01 DIAGNOSIS — C170 Malignant neoplasm of duodenum: Secondary | ICD-10-CM

## 2013-10-01 DIAGNOSIS — I10 Essential (primary) hypertension: Secondary | ICD-10-CM | POA: Insufficient documentation

## 2013-10-01 DIAGNOSIS — D72829 Elevated white blood cell count, unspecified: Secondary | ICD-10-CM | POA: Insufficient documentation

## 2013-10-01 DIAGNOSIS — E7800 Pure hypercholesterolemia, unspecified: Secondary | ICD-10-CM | POA: Insufficient documentation

## 2013-10-01 LAB — CBC
HCT: 37.6 % — ABNORMAL LOW (ref 39.0–52.0)
HEMOGLOBIN: 12.4 g/dL — AB (ref 13.0–17.0)
MCH: 31.4 pg (ref 26.0–34.0)
MCHC: 33 g/dL (ref 30.0–36.0)
MCV: 95.2 fL (ref 78.0–100.0)
PLATELETS: 654 10*3/uL — AB (ref 150–400)
RBC: 3.95 MIL/uL — ABNORMAL LOW (ref 4.22–5.81)
RDW: 15.3 % (ref 11.5–15.5)
WBC: 21.4 10*3/uL — ABNORMAL HIGH (ref 4.0–10.5)

## 2013-10-01 LAB — APTT: aPTT: 39 seconds — ABNORMAL HIGH (ref 24–37)

## 2013-10-01 LAB — PROTIME-INR
INR: 1.11 (ref 0.00–1.49)
PROTHROMBIN TIME: 14.1 s (ref 11.6–15.2)

## 2013-10-01 MED ORDER — LIDOCAINE HCL 1 % IJ SOLN
INTRAMUSCULAR | Status: AC
  Filled 2013-10-01: qty 20

## 2013-10-01 MED ORDER — CEFAZOLIN SODIUM-DEXTROSE 2-3 GM-% IV SOLR
2.0000 g | Freq: Once | INTRAVENOUS | Status: AC
  Administered 2013-10-01: 2 g via INTRAVENOUS
  Filled 2013-10-01: qty 50

## 2013-10-01 MED ORDER — HEPARIN SOD (PORK) LOCK FLUSH 100 UNIT/ML IV SOLN
500.0000 [IU] | Freq: Once | INTRAVENOUS | Status: AC
  Administered 2013-10-01: 500 [IU] via INTRAVENOUS

## 2013-10-01 MED ORDER — MIDAZOLAM HCL 2 MG/2ML IJ SOLN
INTRAMUSCULAR | Status: AC | PRN
  Administered 2013-10-01 (×2): 2 mg via INTRAVENOUS

## 2013-10-01 MED ORDER — FENTANYL CITRATE 0.05 MG/ML IJ SOLN
INTRAMUSCULAR | Status: AC
  Filled 2013-10-01: qty 4

## 2013-10-01 MED ORDER — MIDAZOLAM HCL 2 MG/2ML IJ SOLN
INTRAMUSCULAR | Status: AC
  Filled 2013-10-01: qty 4

## 2013-10-01 MED ORDER — FENTANYL CITRATE 0.05 MG/ML IJ SOLN
INTRAMUSCULAR | Status: AC | PRN
  Administered 2013-10-01: 100 ug via INTRAVENOUS

## 2013-10-01 MED ORDER — HEPARIN SOD (PORK) LOCK FLUSH 100 UNIT/ML IV SOLN
INTRAVENOUS | Status: AC
  Filled 2013-10-01: qty 5

## 2013-10-01 MED ORDER — SODIUM CHLORIDE 0.9 % IV SOLN
Freq: Once | INTRAVENOUS | Status: AC
  Administered 2013-10-01: 10:00:00 via INTRAVENOUS

## 2013-10-01 NOTE — Procedures (Signed)
R IJ Port cathter placement with US and fluoroscopy No complication No blood loss. See complete dictation in [facility] PACS.  

## 2013-10-01 NOTE — H&P (Signed)
Chief Complaint: "I am here for a port to be placed." Referring Physician: Dr. Barnet Glasgow HPI: Trevor Crane is an 73 y.o. 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 The patient is scheduled for therapy next week and Dr. Barnet Glasgow has requested an image guided port a catheter placement. The patient denies any active illness, fever or chills. He denies any urinary symptoms. He did have elevated wbc of 17.5 on 12/30 and Dr. Barnet Glasgow felt this was reactive. The patient today has elevated wbc 21.4, he is afebrile today. He denies any active bleeding, blood in his stool or urine. He denies any complications with previous endoscopy sedation. He denies any history of sleep apnea or chronic use of oxygen. He denies any chest pain or shortness of breath.    Past Medical History:  Past Medical History  Diagnosis Date  . Hypertension   . Hypercholesterolemia   . Panic attacks   . 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 Esophagogastroduodenoscopy (egd) with propofol N/A 09/17/2013    Procedure: ESOPHAGOGASTRODUODENOSCOPY (EGD) WITH PROPOFOL;  Surgeon: Rogene Houston, MD;  Location: [facility];  Service: Gastroenterology;  Laterality: N/A;  . Ercp N/A 09/17/2013    Procedure: ATTEMPTED ENDOSCOPIC RETROGRADE CHOLANGIOPANCREATOGRAPHY (ERCP);  Surgeon: Rogene Houston, MD;  Location: [facility];  Service: Gastroenterology;  Laterality: N/A;  . Esophageal biopsy N/A 09/17/2013    Procedure: BIOPSY;  Surgeon: Rogene Houston, MD;  Location: [facility];  Service: Gastroenterology;  Laterality: N/A;    Family History:  Family History  Problem Relation Age of Onset  . Cancer Mother     colon cancer  . Hypertension Father   . Hypertension Maternal Uncle     Social History:   reports that he has never smoked. He has quit using smokeless tobacco. He reports that he drinks about 0.6 ounces of alcohol per week. He reports that he does not use illicit drugs.  Allergies:  Allergies  Allergen Reactions  . Sulfa Antibiotics Itching and Rash      Medication List    ASK your doctor about these medications       acetaminophen 325 MG tablet  Commonly known as:  TYLENOL  Take 325 mg by mouth every 6 (six) hours as needed for mild pain or moderate pain.     calcium carbonate 500 MG chewable tablet  Commonly known as:  TUMS - dosed in mg elemental calcium  Chew 1 tablet by mouth 3 (three) times daily as needed for indigestion or heartburn.     capecitabine 500 MG tablet  Commonly known as:  XELODA  Take 1,000 mg/m2 by mouth daily. 1000mg  twice a day with food for 7 days on, 7 days off for 6 cycles  Start taking on:  10/05/2013     CLARITIN-D 24 HOUR PO  Take 0.5 tablets by mouth as needed.     FLUoxetine 20 MG capsule  Commonly known as:  PROZAC  Take 20 mg by mouth every morning.     fluticasone 50 MCG/ACT nasal spray  Commonly known as:  FLONASE  Place 1 spray into both nostrils daily as needed for allergies or rhinitis.     lisinopril 5 MG tablet  Commonly known as:  PRINIVIL,ZESTRIL  Take 5 mg by  mouth every morning.     LORazepam 1 MG tablet  Commonly known as:  ATIVAN  Take 1 mg by mouth 3 (three) times daily as needed for anxiety.     methylcellulose 1 % ophthalmic solution  Commonly known as:  ARTIFICIAL TEARS  Place 1 drop into both eyes 2 (two) times daily as needed (dry eyes).     omeprazole 20 MG capsule  Commonly known as:  PRILOSEC  Take 20 mg by mouth daily.     ondansetron 4 MG tablet  Commonly known as:  ZOFRAN  Take 4 mg by mouth every 8 (eight) hours as needed for nausea or vomiting.     OxyCODONE HCl (Abuse Deter) 5 MG Taba  Take 5 mg by mouth every 4 (four) hours as needed (pain).     pantoprazole 40 MG tablet  Commonly  known as:  PROTONIX  Take 40 mg by mouth daily.     simvastatin 40 MG tablet  Commonly known as:  ZOCOR  Take 40 mg by mouth at bedtime.       Please HPI for pertinent positives, otherwise complete 10 system ROS negative.  Physical Exam: BP 120/68  Pulse 95  Temp(Src) 98.9 F (37.2 C) (Oral)  Resp 18  Ht 5\' 9"  (1.753 m)  Wt 197 lb (89.359 kg)  BMI 29.08 kg/m2  SpO2 95% Body mass index is 29.08 kg/(m^2).  General Appearance:  Alert, cooperative, no distress  Head:  Normocephalic, without obvious abnormality, atraumatic  Neck: Supple, symmetrical, trachea midline  Lungs:   Clear to auscultation bilaterally, no w/r/r, respirations unlabored without use of accessory muscles.  Chest Wall:  No tenderness or deformity, no skin changes  Heart:  Regular rate and rhythm, S1, S2 normal, no murmur, rub or gallop.  Abdomen:   Soft, non-tender, non distended, (+) BS  Extremities: Extremities normal, atraumatic, no cyanosis or edema  Neurologic: Normal affect, no gross deficits.   Results for orders placed during the hospital encounter of 10/01/13 (from the past 48 hour(s))  CBC     Status: Abnormal   Collection Time    10/01/13 10:00 AM      Result Value Range   WBC 21.4 (*) 4.0 - 10.5 K/uL   RBC 3.95 (*) 4.22 - 5.81 MIL/uL   Hemoglobin 12.4 (*) 13.0 - 17.0 g/dL   HCT 37.6 (*) 39.0 - 52.0 %   MCV 95.2  78.0 - 100.0 fL   MCH 31.4  26.0 - 34.0 pg   MCHC 33.0  30.0 - 36.0 g/dL   RDW 15.3  11.5 - 15.5 %   Platelets 654 (*) 150 - 400 K/uL   No results found.  Assessment/Plan Adenocarcinoma of the duodenum with extending biliary obstruction s/p percutaneous biliary drain placed 09/21/13 at [facility] with involvement of right ureter s/p right ureteral stent 09/19/13 at [facility].  Request for image guided port a catheter placement. Patient has been NPO, labs reviewed, ancef ordered. Leukocytosis felt to be reactive, afebrile no signs of infection. Risks and Benefits discussed with the  patient. All of the patient's questions were answered, patient is agreeable to proceed. Consent signed and in chart.    Tsosie Billing D PA-C 10/01/2013, 10:27 AM

## 2013-10-01 NOTE — Discharge Instructions (Signed)
Implanted Port Instructions An implanted port is a central line that has a round shape and is placed under the skin. It is used for long-term IV (intravenous) access for:  Medicine.  Fluids.  Liquid nutrition, such as TPN (total parenteral nutrition).  Blood samples. Ports can be placed:  In the chest area just below the collarbone (this is the most common place.)  In the arms.  In the belly (abdomen) area.  In the legs. PARTS OF THE PORT A port has 2 main parts:  The reservoir. The reservoir is round, disc-shaped, and will be a small, raised area under your skin.  The reservoir is the part where a needle is inserted (accessed) to either give medicines or to draw blood.  The catheter. The catheter is a long, slender tube that extends from the reservoir. The catheter is placed into a large vein.  Medicine that is inserted into the reservoir goes into the catheter and then into the vein. INSERTION OF THE PORT  The port is surgically placed in either an operating room or in a procedural area (interventional radiology).  Medicine may be given to help you relax during the procedure.  The skin where the port will be inserted is numbed (local anesthetic).  1 or 2 small cuts (incisions) will be made in the skin to insert the port.  The port can be used after it has been inserted. INCISION SITE CARE  The incision site may have small adhesive strips on it. This helps keep the incision site closed. Sometimes, no adhesive strips are placed. Instead of adhesive strips, a special kind of surgical glue is used to keep the incision closed.  If adhesive strips were placed on the incision sites, do not take them off. They will fall off on their own.  The incision site may be sore for 1 to 2 days. Pain medicine can help.  Do not get the incision site wet. Bathe or shower as directed by your caregiver.  The incision site should heal in 5 to 7 days. A small scar may form after the  incision has healed. ACCESSING THE PORT Special steps must be taken to access the port:  Before the port is accessed, a numbing cream can be placed on the skin. This helps numb the skin over the port site.  A sterile technique is used to access the port.  The port is accessed with a needle. Only "non-coring" port needles should be used to access the port. Once the port is accessed, a blood return should be checked. This helps ensure the port is in the vein and is not clogged (clotted).  If your caregiver believes your port should remain accessed, a clear (transparent) bandage will be placed over the needle site. The bandage and needle will need to be changed every week or as directed by your caregiver.  Keep the bandage covering the needle clean and dry. Do not get it wet. Follow your caregiver's instructions on how to take a shower or bath when the port is accessed.  If your port does not need to stay accessed, no bandage is needed over the port. FLUSHING THE PORT Flushing the port keeps it from getting clogged. How often the port is flushed depends on:  If a constant infusion is running. If a constant infusion is running, the port may not need to be flushed.  If intermittent medicines are given.  If the port is not being used. For intermittent medicines:  The port will  need to be flushed:  After medicines have been given.  After blood has been drawn.  As part of routine maintenance.  A port is normally flushed with:  Normal saline.  Heparin.  Follow your caregiver's advice on how often, how much, and the type of flush to use on your port. IMPORTANT PORT INFORMATION  Tell your caregiver if you are allergic to heparin.  After your port is placed, you will get a manufacturer's information card. The card has information about your port. Keep this card with you at all times.  There are many types of ports available. Know what kind of port you have.  In case of an  emergency, it may be helpful to wear a medical alert bracelet. This can help alert health care workers that you have a port.  The port can stay in for as long as your caregiver believes it is necessary.  When it is time for the port to come out, surgery will be done to remove it. The surgery will be similar to how the port was put in.  If you are in the hospital or clinic:  Your port will be taken care of and flushed by a nurse.  If you are at home:  A home health care nurse may give medicines and take care of the port.  You or a family member can get special training and directions for giving medicine and taking care of the port at home. SEEK IMMEDIATE MEDICAL CARE IF:   Your port does not flush or you are unable to get a blood return.  New drainage or pus is coming from the incision.  A bad smell is coming from the incision site.  You develop swelling or increased redness at the incision site.  You develop increased swelling or pain at the port site.  You develop swelling or pain in the surrounding skin near the port.  You have an oral temperature above 102 F (38.9 C), not controlled by medicine. MAKE SURE YOU:   Understand these instructions.  Will watch your condition.  Will get help right away if you are not doing well or get worse. Document Released: 09/16/2005 Document Revised: 12/09/2011 Document Reviewed: 12/08/2008 [facility] Patient Information 2014 [place]. Moderate Sedation, Adult Moderate sedation is given to help you relax or even sleep through a procedure. You may remain sleepy, be clumsy, or have poor balance for several hours following this procedure. Arrange for a responsible adult, family member, or friend to take you home. A responsible adult should stay with you for at least 24 hours or until the medicines have worn off.  Do not participate in any activities where you could become injured for the next 24 hours, or until you feel normal again. Do  not:  Drive.  Swim.  Ride a bicycle.  Operate heavy machinery.  Cook.  Use power tools.  Climb ladders.  Work at [employer].  Do not make important decisions or sign legal documents until you are improved.  Vomiting may occur if you eat too soon. When you can drink without vomiting, try water, juice, or soup. Try solid foods if you feel little or no nausea.  Only take over-the-counter or prescription medications for pain, discomfort, or fever as directed by your caregiver.If pain medications have been prescribed for you, ask your caregiver how soon it is safe to take them.  Make sure you and your family fully understands everything about the medication given to you. Make sure you understand  what side effects may occur.  You should not drink alcohol, take sleeping pills, or medications that cause drowsiness for at least 24 hours.  If you smoke, do not smoke alone.  If you are feeling better, you may resume normal activities 24 hours after receiving sedation.  Keep all appointments as scheduled. Follow all instructions.  Ask questions if you do not understand. SEEK MEDICAL CARE IF:   Your skin is pale or bluish in color.  You continue to feel sick to your stomach (nauseous) or throw up (vomit).  Your pain is getting worse and not helped by medication.  You have bleeding or swelling.  You are still sleepy or feeling clumsy after 24 hours. SEEK IMMEDIATE MEDICAL CARE IF:   You develop a rash.  You have difficulty breathing.  You develop any type of allergic problem.  You have a fever. Document Released: 06/11/2001 Document Revised: 12/09/2011 Document Reviewed: 11/02/2007 [facility] Patient Information 2014 [place].

## 2013-10-04 ENCOUNTER — Other Ambulatory Visit (HOSPITAL_COMMUNITY)
Admission: RE | Admit: 2013-10-04 | Discharge: 2013-10-04 | Disposition: A | Discharge status: Home / Self Care | Payer: Medicare Other | Source: Ambulatory Visit | Attending: Hematology and Oncology | Admitting: Hematology and Oncology

## 2013-10-04 ENCOUNTER — Other Ambulatory Visit (HOSPITAL_COMMUNITY): Payer: Medicare Other

## 2013-10-04 DIAGNOSIS — C170 Malignant neoplasm of duodenum: Secondary | ICD-10-CM | POA: Insufficient documentation

## 2013-10-05 ENCOUNTER — Other Ambulatory Visit (HOSPITAL_COMMUNITY): Payer: Self-pay | Admitting: Hematology and Oncology

## 2013-10-05 DIAGNOSIS — C170 Malignant neoplasm of duodenum: Secondary | ICD-10-CM

## 2013-10-05 MED ORDER — LIDOCAINE-PRILOCAINE 2.5-2.5 % EX CREA: TOPICAL_CREAM | CUTANEOUS | Status: DC

## 2013-10-05 MED ORDER — DEXAMETHASONE 4 MG PO TABS: ORAL_TABLET | ORAL | Status: DC

## 2013-10-05 MED ORDER — DIPHENOXYLATE-ATROPINE 2.5-0.025 MG PO TABS: 1.0000 | ORAL_TABLET | Freq: Four times a day (QID) | ORAL | Status: DC | PRN

## 2013-10-05 MED ORDER — METOCLOPRAMIDE HCL 5 MG PO TABS: ORAL_TABLET | ORAL | Status: DC

## 2013-10-05 MED ORDER — PROCHLORPERAZINE MALEATE 10 MG PO TABS: 10.0000 mg | ORAL_TABLET | Freq: Four times a day (QID) | ORAL | Status: DC | PRN

## 2013-10-05 NOTE — Patient Instructions (Addendum)
[place]   CHEMOTHERAPY INSTRUCTIONS  Oxaliplatin - anaphylactic reaction, neurotoxicity (i.e., headache, fatigue, difficulty sleeping, pain). Peripheral neuropathy (numbness/tingling/burning in hands/fingers/feet/toes) - will be aggravated by cold/cool temperatures. We need to know when you develop peripheral neuropathy so that we can monitor it and treat if necessary. Nausea/vomiting, diarrhea, bone marrow suppression (lowers white blood cells (fight infection), lowers red blood cells (make up your blood), lowers platelets (help blood to clot). Pulmonary fibrosis. Once you have received Oxaliplatin do NOT eat or drinking anything cold/cool for 5-10 days! Do NOT breathe in cold/cool air and do NOT touch anything cold for 5-10 days. The time frame varies from patient to patient on the length of time you must abstain from the above mentioned. Best advice is to wait at least 5 days before attempting to reintroduce cold/cool back into life. Slowly reintroduce cool/cold things! Wear gloves when getting items out of the refrigerator (of course, these would be things you are going to heat to eat)!   Xeloda - diarrhea, hand-foot syndrome (hands/feet can get red/tender/and skin can peel). Avoid friction and hot environments on hands/feet. Wear cotton socks. Lotion twice a day to hands/fingers/feet/toes with Udder cream. Mucositis (inflammation of any mucosal membrane can develop -this can occur in the throat/mouth). Mouth sores, nausea/vomiting, anemia, fatigue can also develop. Take Imodium if diarrhea develops and contact us immediately - we will give you further instructions on how to take your Imodium. Xeloda usually comes with a teaching packet from the mail order/specialty pharmacy that will supply you with the drug that is really informative about diarrhea and hand-foot syndrome. No pregnant, child bearing age people, or animals should come into contact with this drug.  Even though this is in pill form - it is still very powerful!!! If you should be instructed to discontinue taking this drug, bring the drug into the [facility] and we will dispose of it in a safe manner. Chemotherapy is a biohazard and must be disposed of properly. Do not touch this pill much. It would be best if the caregiver wore gloves while handling. Do not put this pill in with the rest of your pills in a pill box. Keep them in a separate pill box. If you are taking Coumadin while taking this drug, we will need to monitor your PT/INR (lab) closely because Xeloda can increase the effectiveness of Coumadin. You should take this medication within 30 minutes after eating your am and pm meals.    POTENTIAL SIDE EFFECTS OF TREATMENT: Increased Susceptibility to Infection, Vomiting, Hair Thinning, Changes in Character of Skin and Nails (brittleness, dryness,etc.), Pigment Changes (darkening of nail beds, palms of hands, soles of feet, etc.), Bone Marrow Suppression, Abdominal Cramping, Complete Hair Loss, Nausea, Diarrhea, Sun Sensitivity and Mouth Sores   SELF IMAGE NEEDS AND REFERRALS MADE: Obtain hair accessories as soon as possible (caps,etc.)   EDUCATIONAL MATERIALS GIVEN AND REVIEWED: Chemotherapy and You  Specific Instructions Sheets: Oxaliplatin, Xeloda   SELF CARE ACTIVITIES WHILE ON CHEMOTHERAPY: Increase your fluid intake 48 hours prior to treatment and drink at least 2 quarts per day after treatment., No alcohol intake., No aspirin or other medications unless approved by your oncologist., Eat foods that are light and easy to digest., Eat foods at cold or room temperature., No fried, fatty, or spicy foods immediately before or after treatment., Have teeth cleaned professionally before starting treatment. Keep dentures and partial plates clean., Use soft toothbrush and do not use mouthwashes that contain  alcohol. Biotene is a good mouthwash. Use warm salt water gargles (1 teaspoon salt  per 1 quart warm water) before and after meals and at bedtime. Or you may rinse with 2 tablespoons of three -percent hydrogen peroxide mixed in eight ounces of water., Always use sunscreen with SPF (Sun Protection Factor) of 30 or higher., Use your nausea medication as directed to prevent nausea., Use your stool softener or laxative as directed to prevent constipation. and Use your anti-diarrheal medication as directed to stop diarrhea.  Please wash your hands for at least 30 seconds using warm soapy water. Handwashing is the #1 way to prevent the spread of germs. Stay away from sick people or people who are getting over a cold. If you develop respiratory systems such as green/yellow mucus production or productive cough or persistent cough let us know and we will see if you need an antibiotic. It is a good idea to keep a pair of gloves on when going into grocery stores/[employer] to decrease your risk of coming into contact with germs on the carts, etc. Carry alcohol hand gel with you at all times and use it frequently if out in public. All foods need to be cooked thoroughly. No raw foods. No medium or undercooked meats, eggs. If your food is cooked medium well, it does not need to be hot pink or saturated with bloody liquid at all. Vegetables and fruits need to be washed/rinsed under the faucet with a dish detergent before being consumed. You can eat raw fruits and vegetables unless we tell you otherwise but it would be best if you cooked them or bought frozen. Do not eat off of salad bars or hot bars unless you really trust the cleanliness of the restaurant. If you need dental work, please let us know before you go for your appointment so that we can coordinate the best possible time for you in regards to your chemo regimen. You need to also let your dentist know that you are actively taking chemo. We may need to do labs prior to your dental appointment. We also want your bowels moving at least every other day. If  this is not happening, we need to know so that we can get you on a bowel regimen to help you go.       MEDICATIONS: You have been given prescriptions for the following medications:  Dexamethasone 38m tablet. Starting the day after chemo, take 2 tablets in the am and 2 tablets in the pm for 2 days. Then Stop. Repeat with each chemo. (take with food).   Metoclopramide/Reglan 57mtablet. Starting the day after chemo, take 1 tablet four times a day x 48 hours. Then may take 1 tablet four times a day IF needed for nausea/vomiting.   Prochlorperazine/Compazine 104mablet. May take 1 tablet four times a day IF needed for nausea/vomiting. (May take this in addition to the Metoclopramide.)  EMLA cream. Apply a quarter size amount to port site 1 hour prior to chemo. Do not rub in. Cover with plastic wrap.     Over-the-Counter Meds:  Imodium - this is for diarrhea. Take 2 tabs after 1st loose stool and then 1 tab after each loose stool until you go a total of 12 hours without a loose stool. Call [facility] loose stools continue.  Colace - this is a stool softener. Take 100m100mpsule 2-6 times a day as needed. If you have to take more than 6 capsules of Colace a day call  the [facility].  Senna - this is a mild laxative used to treat mild constipation. May take 2 tabs by mouth daily or up to twice a day as needed for mild constipation.  Milk of Magnesia - this is a laxative used to treat moderate to severe constipation. May take 2-4 tablespoons every 8 hours as needed. May increase to 8 tablespoons x 1 dose and if no bowel movement call the [place] [facility].    SYMPTOMS TO REPORT AS SOON AS POSSIBLE AFTER TREATMENT:  FEVER GREATER THAN 100.5 F  CHILLS WITH OR WITHOUT FEVER  NAUSEA AND VOMITING THAT IS NOT CONTROLLED WITH YOUR NAUSEA MEDICATION  UNUSUAL SHORTNESS OF BREATH  UNUSUAL BRUISING OR BLEEDING  TENDERNESS IN MOUTH AND THROAT WITH OR WITHOUT PRESENCE OF ULCERS  URINARY  PROBLEMS  BOWEL PROBLEMS  UNUSUAL RASH    Wear comfortable clothing and clothing appropriate for easy access to any Portacath or PICC line. Let us know if there is anything that we can do to make your therapy better!      I have been informed and understand all of the instructions given to me and have received a copy. I have been instructed to call the clinic [phone] or my family physician as soon as possible for continued medical care, if indicated. I do not have any more questions at this time but understand that I may call the [facility] or the Patient Navigator at [phone] during office hours should I have questions or need assistance in obtaining follow-up care.      _________________________________________      _______________     __________ Signature of Patient or Authorized Representative        Date                            Time      _________________________________________ Nurse's Signature      Oxaliplatin Injection What is this medicine? OXALIPLATIN (ox AL i PLA tin) is a chemotherapy drug. It targets fast dividing cells, like cancer cells, and causes these cells to die. This medicine is used to treat cancers of the colon and rectum, and many other cancers. This medicine may be used for other purposes; ask your health care provider or pharmacist if you have questions. COMMON BRAND NAME(S): Eloxatin What should I tell my health care provider before I take this medicine? They need to know if you have any of these conditions: -kidney disease -an unusual or allergic reaction to oxaliplatin, other chemotherapy, other medicines, foods, dyes, or preservatives -pregnant or trying to get pregnant -breast-feeding How should I use this medicine? This drug is given as an infusion into a vein. It is administered in a hospital or clinic by a specially trained health care professional. Talk to your pediatrician regarding the use of this medicine in  children. Special care may be needed. Overdosage: If you think you have taken too much of this medicine contact a poison control center or emergency room at once. NOTE: This medicine is only for you. Do not share this medicine with others. What if I miss a dose? It is important not to miss a dose. Call your doctor or health care professional if you are unable to keep an appointment. What may interact with this medicine? -medicines to increase blood counts like filgrastim, pegfilgrastim, sargramostim -probenecid -some antibiotics like amikacin, gentamicin, neomycin, polymyxin B, streptomycin, tobramycin -zalcitabine Talk to your doctor or  health care professional before taking any of these medicines: -acetaminophen -aspirin -ibuprofen -ketoprofen -naproxen This list may not describe all possible interactions. Give your health care provider a list of all the medicines, herbs, non-prescription drugs, or dietary supplements you use. Also tell them if you smoke, drink alcohol, or use illegal drugs. Some items may interact with your medicine. What should I watch for while using this medicine? Your condition will be monitored carefully while you are receiving this medicine. You will need important blood work done while you are taking this medicine. This medicine can make you more sensitive to cold. Do not drink cold drinks or use ice. Cover exposed skin before coming in contact with cold temperatures or cold objects. When out in cold weather wear warm clothing and cover your mouth and nose to warm the air that goes into your lungs. Tell your doctor if you get sensitive to the cold. This drug may make you feel generally unwell. This is not uncommon, as chemotherapy can affect healthy cells as well as cancer cells. Report any side effects. Continue your course of treatment even though you feel ill unless your doctor tells you to stop. In some cases, you may be given additional medicines to help with side  effects. Follow all directions for their use. Call your doctor or health care professional for advice if you get a fever, chills or sore throat, or other symptoms of a cold or flu. Do not treat yourself. This drug decreases your body's ability to fight infections. Try to avoid being around people who are sick. This medicine may increase your risk to bruise or bleed. Call your doctor or health care professional if you notice any unusual bleeding. Be careful brushing and flossing your teeth or using a toothpick because you may get an infection or bleed more easily. If you have any dental work done, tell your dentist you are receiving this medicine. Avoid taking products that contain aspirin, acetaminophen, ibuprofen, naproxen, or ketoprofen unless instructed by your doctor. These medicines may hide a fever. Do not become pregnant while taking this medicine. Women should inform their doctor if they wish to become pregnant or think they might be pregnant. There is a potential for serious side effects to an unborn child. Talk to your health care professional or pharmacist for more information. Do not breast-feed an infant while taking this medicine. Call your doctor or health care professional if you get diarrhea. Do not treat yourself. What side effects may I notice from receiving this medicine? Side effects that you should report to your doctor or health care professional as soon as possible: -allergic reactions like skin rash, itching or hives, swelling of the face, lips, or tongue -low blood counts - This drug may decrease the number of white blood cells, red blood cells and platelets. You may be at increased risk for infections and bleeding. -signs of infection - fever or chills, cough, sore throat, pain or difficulty passing urine -signs of decreased platelets or bleeding - bruising, pinpoint red spots on the skin, black, tarry stools, nosebleeds -signs of decreased red blood cells - unusually weak or  tired, fainting spells, lightheadedness -breathing problems -chest pain, pressure -cough -diarrhea -jaw tightness -mouth sores -nausea and vomiting -pain, swelling, redness or irritation at the injection site -pain, tingling, numbness in the hands or feet -problems with balance, talking, walking -redness, blistering, peeling or loosening of the skin, including inside the mouth -trouble passing urine or change in the amount of urine  Side effects that usually do not require medical attention (report to your doctor or health care professional if they continue or are bothersome): -changes in vision -constipation -hair loss -loss of appetite -metallic taste in the mouth or changes in taste -stomach pain This list may not describe all possible side effects. Call your doctor for medical advice about side effects. You may report side effects to FDA at 1-800-FDA-1088. Where should I keep my medicine? This drug is given in a hospital or clinic and will not be stored at home. NOTE: This sheet is a summary. It may not cover all possible information. If you have questions about this medicine, talk to your doctor, pharmacist, or health care provider.  2014, Elsevier/Gold Standard. (2008-04-12 17:22:47) Capecitabine tablets What is this medicine? CAPECITABINE (ka pe SITE a been) is a chemotherapy drug. It slows the growth of cancer cells. This medicine is used to treat breast cancer, and also colon or rectal cancer. This medicine may be used for other purposes; ask your health care provider or pharmacist if you have questions. COMMON BRAND NAME(S): Xeloda What should I tell my health care provider before I take this medicine? They need to know if you have any of these conditions: -bleeding or blood disorders -dihydropyrimidine dehydrogenase (DPD) deficiency -heart disease -infection (especially a virus infection such as chickenpox, cold sores, or herpes) -kidney disease -liver disease -an  unusual or allergic reaction to capecitabine, 5-fluorouracil, other medicines, foods, dyes, or preservatives -pregnant or trying to get pregnant -breast-feeding How should I use this medicine? Take this medicine by mouth with a glass of water, within 30 minutes of the end of a meal. Follow the directions on the prescription label. Take your medicine at regular intervals. Do not take it more often than directed. Do not stop taking except on your doctor's advice. Your doctor may want you to take a combination of 150 mg and 500 mg tablets for each dose. It is very important that you know how to correctly take your dose. Taking the wrong tablets could result in an overdose (too much medication) or underdose (too little medication). Talk to your pediatrician regarding the use of this medicine in children. Special care may be needed. Overdosage: If you think you have taken too much of this medicine contact a poison control center or emergency room at once. NOTE: This medicine is only for you. Do not share this medicine with others. What if I miss a dose? If you miss a dose, do not take the missed dose at all. Do not take double or extra doses. Instead, continue with your next scheduled dose and check with your doctor. What may interact with this medicine? -antacids with aluminum and/or magnesium -folic acid -leucovorin -medicines to increase blood counts like filgrastim, pegfilgrastim, sargramostim -phenytoin -vaccines -warfarin Talk to your doctor or health care professional before taking any of these medicines: -acetaminophen -aspirin -ibuprofen -ketoprofen -naproxen This list may not describe all possible interactions. Give your health care provider a list of all the medicines, herbs, non-prescription drugs, or dietary supplements you use. Also tell them if you smoke, drink alcohol, or use illegal drugs. Some items may interact with your medicine. What should I watch for while using this  medicine? Visit your doctor for checks on your progress. This drug may make you feel generally unwell. This is not uncommon, as chemotherapy can affect healthy cells as well as cancer cells. Report any side effects. Continue your course of treatment even though you feel ill  unless your doctor tells you to stop. In some cases, you may be given additional medicines to help with side effects. Follow all directions for their use. Call your doctor or health care professional for advice if you get a fever, chills or sore throat, or other symptoms of a cold or flu. Do not treat yourself. This drug decreases your body's ability to fight infections. Try to avoid being around people who are sick. This medicine may increase your risk to bruise or bleed. Call your doctor or health care professional if you notice any unusual bleeding. Be careful brushing and flossing your teeth or using a toothpick because you may get an infection or bleed more easily. If you have any dental work done, tell your dentist you are receiving this medicine. Avoid taking products that contain aspirin, acetaminophen, ibuprofen, naproxen, or ketoprofen unless instructed by your doctor. These medicines may hide a fever. Do not become pregnant while taking this medicine. Women should inform their doctor if they wish to become pregnant or think they might be pregnant. There is a potential for serious side effects to an unborn child. Talk to your health care professional or pharmacist for more information. Do not breast-feed an infant while taking this medicine. Men are advised not to father a child while taking this medicine. What side effects may I notice from receiving this medicine? Side effects that you should report to your doctor or health care professional as soon as possible: -allergic reactions like skin rash, itching or hives, swelling of the face, lips, or tongue -low blood counts - this medicine may decrease the number of white  blood cells, red blood cells and platelets. You may be at increased risk for infections and bleeding. -signs of infection - fever or chills, cough, sore throat, pain or difficulty passing urine -signs of decreased platelets or bleeding - bruising, pinpoint red spots on the skin, black, tarry stools, blood in the urine -signs of decreased red blood cells - unusually weak or tired, fainting spells, lightheadedness -breathing problems -changes in vision -chest pain -diarrhea of more than 4 bowel movements in one day or any diarrhea at night -mouth sores -nausea and vomiting -pain, swelling, redness at site where injected -pain, tingling, numbness in the hands or feet -redness, swelling, or sores on hands or feet -stomach pain -vomiting -yellow color of skin or eyes Side effects that usually do not require medical attention (report to your doctor or health care professional if they continue or are bothersome): -constipation -diarrhea -dry or itchy skin -hair loss -loss of appetite -nausea -weak or tired This list may not describe all possible side effects. Call your doctor for medical advice about side effects. You may report side effects to FDA at 1-800-FDA-1088. Where should I keep my medicine? Keep out of the reach of children. Store at room temperature between 15 and 30 degrees C (59 and 86 degrees F). Keep container tightly closed. Throw away any unused medicine after the expiration date. NOTE: This sheet is a summary. It may not cover all possible information. If you have questions about this medicine, talk to your doctor, pharmacist, or health care provider.  2014, Elsevier/Gold Standard. (2008-08-29 11:47:41) Metoclopramide tablets What is this medicine? METOCLOPRAMIDE (met oh kloe PRA mide) is used to treat the symptoms of gastroesophageal reflux disease (GERD) like heartburn. It is also used to treat people with slow emptying of the stomach and intestinal tract. This medicine  may be used for other purposes; ask your health  care provider or pharmacist if you have questions. COMMON BRAND NAME(S): Reglan What should I tell my health care provider before I take this medicine? They need to know if you have any of these conditions: -breast cancer -depression -diabetes -heart failure -high blood pressure -kidney disease -liver disease -Parkinson's disease or a movement disorder -pheochromocytoma -seizures -stomach obstruction, bleeding, or perforation -an unusual or allergic reaction to metoclopramide, procainamide, sulfites, other medicines, foods, dyes, or preservatives -pregnant or trying to get pregnant -breast-feeding How should I use this medicine? Take this medicine by mouth with a glass of water. Follow the directions on the prescription label. Take this medicine on an empty stomach, about 30 minutes before eating. Take your doses at regular intervals. Do not take your medicine more often than directed. Do not stop taking except on the advice of your doctor or health care professional. A special MedGuide will be given to you by the pharmacist with each prescription and refill. Be sure to read this information carefully each time. Talk to your pediatrician regarding the use of this medicine in children. Special care may be needed. Overdosage: If you think you have taken too much of this medicine contact a poison control center or emergency room at once. NOTE: This medicine is only for you. Do not share this medicine with others. What if I miss a dose? If you miss a dose, take it as soon as you can. If it is almost time for your next dose, take only that dose. Do not take double or extra doses. What may interact with this medicine? -acetaminophen -cyclosporine -digoxin -medicines for blood pressure -medicines for diabetes, including insulin -medicines for hay fever and other allergies -medicines for depression, especially an Monoamine Oxidase Inhibitor  (MAOI) -medicines for Parkinson's disease, like levodopa -medicines for sleep or for pain -tetracycline This list may not describe all possible interactions. Give your health care provider a list of all the medicines, herbs, non-prescription drugs, or dietary supplements you use. Also tell them if you smoke, drink alcohol, or use illegal drugs. Some items may interact with your medicine. What should I watch for while using this medicine? It may take a few weeks for your stomach condition to start to get better. However, do not take this medicine for longer than 12 weeks. The longer you take this medicine, and the more you take it, the greater your chances are of developing serious side effects. If you are an elderly patient, a male patient, or you have diabetes, you may be at an increased risk for side effects from this medicine. Contact your doctor immediately if you start having movements you cannot control such as lip smacking, rapid movements of the tongue, involuntary or uncontrollable movements of the eyes, head, arms and legs, or muscle twitches and spasms. Patients and their families should watch out for worsening depression or thoughts of suicide. Also watch out for any sudden or severe changes in feelings such as feeling anxious, agitated, panicky, irritable, hostile, aggressive, impulsive, severely restless, overly excited and hyperactive, or not being able to sleep. If this happens, especially at the beginning of treatment or after a change in dose, call your doctor. Do not treat yourself for high fever. Ask your doctor or health care professional for advice. You may get drowsy or dizzy. Do not drive, use machinery, or do anything that needs mental alertness until you know how this drug affects you. Do not stand or sit up quickly, especially if you are an older patient.  This reduces the risk of dizzy or fainting spells. Alcohol can make you more drowsy and dizzy. Avoid alcoholic drinks. What  side effects may I notice from receiving this medicine? Side effects that you should report to your doctor or health care professional as soon as possible: -allergic reactions like skin rash, itching or hives, swelling of the face, lips, or tongue -abnormal production of milk in females -breast enlargement in both males and females -change in the way you walk -difficulty moving, speaking or swallowing -drooling, lip smacking, or rapid movements of the tongue -excessive sweating -fever -involuntary or uncontrollable movements of the eyes, head, arms and legs -irregular heartbeat or palpitations -muscle twitches and spasms -unusually weak or tired Side effects that usually do not require medical attention (report to your doctor or health care professional if they continue or are bothersome): -change in sex drive or performance -depressed mood -diarrhea -difficulty sleeping -headache -menstrual changes -restless or nervous This list may not describe all possible side effects. Call your doctor for medical advice about side effects. You may report side effects to FDA at 1-800-FDA-1088. Where should I keep my medicine? Keep out of the reach of children. Store at room temperature between 20 and 25 degrees C (68 and 77 degrees F). Protect from light. Keep container tightly closed. Throw away any unused medicine after the expiration date. NOTE: This sheet is a summary. It may not cover all possible information. If you have questions about this medicine, talk to your doctor, pharmacist, or health care provider.  2014, Else[employer]. (2012-01-14 13:04:38) Prochlorperazine tablets What is this medicine? PROCHLORPERAZINE (proe klor PER a zeen) helps to control severe nausea and vomiting. This medicine is also used to treat schizophrenia. It can also help patients who experience anxiety that is not due to psychological illness. This medicine may be used for other purposes; ask your health  care provider or pharmacist if you have questions. COMMON BRAND NAME(S): Compazine What should I tell my health care provider before I take this medicine? They need to know if you have any of these conditions: -blood disorders or disease -dementia -liver disease or jaundice -Parkinson's disease -uncontrollable movement disorder -an unusual or allergic reaction to prochlorperazine, other medicines, foods, dyes, or preservatives -pregnant or trying to get pregnant -breast-feeding How should I use this medicine? Take this medicine by mouth with a glass of water. Follow the directions on the prescription label. Take your doses at regular intervals. Do not take your medicine more often than directed. Do not stop taking this medicine suddenly. This can cause nausea, vomiting, and dizziness. Ask your doctor or health care professional for advice. Talk to your pediatrician regarding the use of this medicine in children. Special care may be needed. While this drug may be prescribed for children as young as 2 years for selected conditions, precautions do apply. Overdosage: If you think you have taken too much of this medicine contact a poison control center or emergency room at once. NOTE: This medicine is only for you. Do not share this medicine with others. What if I miss a dose? If you miss a dose, take it as soon as you can. If it is almost time for your next dose, take only that dose. Do not take double or extra doses. What may interact with this medicine? Do not take this medicine with any of the following medications: -amoxapine -antidepressants like citalopram, escitalopram, fluoxetine, paroxetine, and sertraline -deferoxamine -dofetilide -maprotiline -tricyclic antidepressants like amitriptyline, clomipramine, imipramine, nortiptyline and  others This medicine may also interact with the following medications: -lithium -medicines for pain -phenytoin -propranolol -warfarin This list may  not describe all possible interactions. Give your health care provider a list of all the medicines, herbs, non-prescription drugs, or dietary supplements you use. Also tell them if you smoke, drink alcohol, or use illegal drugs. Some items may interact with your medicine. What should I watch for while using this medicine? Visit your doctor or health care professional for regular checks on your progress. You may get drowsy or dizzy. Do not drive, use machinery, or do anything that needs mental alertness until you know how this medicine affects you. Do not stand or sit up quickly, especially if you are an older patient. This reduces the risk of dizzy or fainting spells. Alcohol may interfere with the effect of this medicine. Avoid alcoholic drinks. This medicine can reduce the response of your body to heat or cold. Dress warm in cold weather and stay hydrated in hot weather. If possible, avoid extreme temperatures like saunas, hot tubs, very hot or cold showers, or activities that can cause dehydration such as vigorous exercise. This medicine can make you more sensitive to the sun. Keep out of the sun. If you cannot avoid being in the sun, wear protective clothing and use sunscreen. Do not use sun lamps or tanning beds/booths. Your mouth may get dry. Chewing sugarless gum or sucking hard candy, and drinking plenty of water may help. Contact your doctor if the problem does not go away or is severe. What side effects may I notice from receiving this medicine? Side effects that you should report to your doctor or health care professional as soon as possible: -blurred vision -breast enlargement in men or women -breast milk in women who are not breast-feeding -chest pain, fast or irregular heartbeat -confusion, restlessness -dark yellow or brown urine -difficulty breathing or swallowing -dizziness or fainting spells -drooling, shaking, movement difficulty (shuffling walk) or rigidity -fever, chills, sore  throat -involuntary or uncontrollable movements of the eyes, mouth, head, arms, and legs -seizures -stomach area pain -unusually weak or tired -unusual bleeding or bruising -yellowing of skin or eyes Side effects that usually do not require medical attention (report to your doctor or health care professional if they continue or are bothersome): -difficulty passing urine -difficulty sleeping -headache -sexual dysfunction -skin rash, or itching This list may not describe all possible side effects. Call your doctor for medical advice about side effects. You may report side effects to FDA at 1-800-FDA-1088. Where should I keep my medicine? Keep out of the reach of children. Store at room temperature between 15 and 30 degrees C (59 and 86 degrees F). Protect from light. Throw away any unused medicine after the expiration date. NOTE: This sheet is a summary. It may not cover all possible information. If you have questions about this medicine, talk to your doctor, pharmacist, or health care provider.  2014, [employer] Standard. (2012-02-04 16:59:39) Lidocaine; Prilocaine cream What is this medicine? LIDOCAINE; PRILOCAINE (LYE doe kane; PRIL oh kane) is a topical anesthetic that causes loss of feeling in the skin and surrounding tissues. It is used to numb the skin before procedures or injections. This medicine may be used for other purposes; ask your health care provider or pharmacist if you have questions. COMMON BRAND NAME(S): EMLA What should I tell my health care provider before I take this medicine? They need to know if you have any of these conditions: -glucose-6-phosphate deficiencies -heart disease -kidney  or liver disease -methemoglobinemia -an unusual or allergic reaction to lidocaine, prilocaine, other medicines, foods, dyes, or preservatives -pregnant or trying to get pregnant -breast-feeding How should I use this medicine? This medicine is for external use only on the  skin. Do not take by mouth. Follow the directions on the prescription label. Wash hands before and after use. Do not use more or leave in contact with the skin longer than directed. Do not apply to eyes or open wounds. It can cause irritation and blurred or temporary loss of vision. If this medicine comes in contact with your eyes, immediately rinse the eye with water. Do not touch or rub the eye. Contact your health care provider right away. Talk to your pediatrician regarding the use of this medicine in children. While this medicine may be prescribed for children for selected conditions, precautions do apply. Overdosage: If you think you have taken too much of this medicine contact a poison control center or emergency room at once. NOTE: This medicine is only for you. Do not share this medicine with others. What if I miss a dose? This medicine is usually only applied once prior to each procedure. It must be in contact with the skin for a period of time for it to work. If you applied this medicine later than directed, tell your health care professional before starting the procedure. What may interact with this medicine? -acetaminophen -chloroquine -dapsone -medicines to control heart rhythm -nitrates like nitroglycerin and nitroprusside -other ointments, creams, or sprays that may contain anesthetic medicine -phenobarbital -phenytoin -quinine -sulfonamides like sulfacetamide, sulfamethoxazole, sulfasalazine and others This list may not describe all possible interactions. Give your health care provider a list of all the medicines, herbs, non-prescription drugs, or dietary supplements you use. Also tell them if you smoke, drink alcohol, or use illegal drugs. Some items may interact with your medicine. What should I watch for while using this medicine? Be careful to avoid injury to the treated area while it is numb and you are not aware of pain. Avoid scratching, rubbing, or exposing the treated  area to hot or cold temperatures until complete sensation has returned. The numb feeling will wear off a few hours after applying the cream. What side effects may I notice from receiving this medicine? Side effects that you should report to your doctor or health care professional as soon as possible: -blurred vision -chest pain -difficulty breathing -dizziness -drowsiness -fast or irregular heartbeat -skin rash or itching -swelling of your throat, lips, or face -trembling Side effects that usually do not require medical attention (report to your doctor or health care professional if they continue or are bothersome): -changes in ability to feel hot or cold -redness and swelling at the application site This list may not describe all possible side effects. Call your doctor for medical advice about side effects. You may report side effects to FDA at 1-800-FDA-1088. Where should I keep my medicine? Keep out of reach of children. Store at room temperature between 15 and 30 degrees C (59 and 86 degrees F). Keep container tightly closed. Throw away any unused medicine after the expiration date. NOTE: This sheet is a summary. It may not cover all possible information. If you have questions about this medicine, talk to your doctor, pharmacist, or health care provider.  2014, [employer] Standard. (2008-03-21 17:14:35) Dexamethasone tablets What is this medicine? DEXAMETHASONE (dex a METH a sone) is a corticosteroid. It is commonly used to treat inflammation of the skin, joints, lungs, and  other organs. Common conditions treated include asthma, allergies, and arthritis. It is also used for other conditions, such as blood disorders and diseases of the adrenal glands. This medicine may be used for other purposes; ask your health care provider or pharmacist if you have questions. COMMON BRAND NAME(S): Decadron, DexPak Sterling Big, DexPak TaperPak, Zema-Pak What should I tell my health care provider  before I take this medicine? They need to know if you have any of these conditions: -Cushing's syndrome -diabetes -glaucoma -heart problems or disease -high blood pressure -infection like herpes, measles, tuberculosis, or chickenpox -kidney disease -liver disease -mental problems -myasthenia gravis -osteoporosis -previous heart attack -seizures -stomach, ulcer or intestine disease including colitis and diverticulitis -thyroid problem -an unusual or allergic reaction to dexamethasone, corticosteroids, other medicines, lactose, foods, dyes, or preservatives -pregnant or trying to get pregnant -breast-feeding How should I use this medicine? Take this medicine by mouth with a drink of water. Follow the directions on the prescription label. Take it with food or milk to avoid stomach upset. If you are taking this medicine once a day, take it in the morning. Do not take more medicine than you are told to take. Do not suddenly stop taking your medicine because you may develop a severe reaction. Your doctor will tell you how much medicine to take. If your doctor wants you to stop the medicine, the dose may be slowly lowered over time to avoid any side effects. Talk to your pediatrician regarding the use of this medicine in children. Special care may be needed. Patients over 50 years old may have a stronger reaction and need a smaller dose. Overdosage: If you think you have taken too much of this medicine contact a poison control center or emergency room at once. NOTE: This medicine is only for you. Do not share this medicine with others. What if I miss a dose? If you miss a dose, take it as soon as you can. If it is almost time for your next dose, talk to your doctor or health care professional. You may need to miss a dose or take an extra dose. Do not take double or extra doses without advice. What may interact with this medicine? Do not take this medicine with any of the following  medications: -mifepristone, RU-486 -vaccines This medicine may also interact with the following medications: -amphotericin B -antibiotics like clarithromycin, erythromycin, and troleandomycin -aspirin and aspirin-like drugs -barbiturates like phenobarbital -carbamazepine -cholestyramine -cholinesterase inhibitors like donepezil, galantamine, rivastigmine, and tacrine -cyclosporine -digoxin -diuretics -ephedrine -male hormones, like estrogens or progestins and birth control pills -indinavir -isoniazid -ketoconazole -medicines for diabetes -medicines that improve muscle tone or strength for conditions like myasthenia gravis -NSAIDs, medicines for pain and inflammation, like ibuprofen or naproxen -phenytoin -rifampin -thalidomide -warfarin This list may not describe all possible interactions. Give your health care provider a list of all the medicines, herbs, non-prescription drugs, or dietary supplements you use. Also tell them if you smoke, drink alcohol, or use illegal drugs. Some items may interact with your medicine. What should I watch for while using this medicine? Visit your doctor or health care professional for regular checks on your progress. If you are taking this medicine over a prolonged period, carry an identification card with your name and address, the type and dose of your medicine, and your doctor's name and address. This medicine may increase your risk of getting an infection. Stay away from people who are sick. Tell your doctor or health care professional if you  are around anyone with measles or chickenpox. If you are going to have surgery, tell your doctor or health care professional that you have taken this medicine within the last twelve months. Ask your doctor or health care professional about your diet. You may need to lower the amount of salt you eat. The medicine can increase your blood sugar. If you are a diabetic check with your doctor if you need help  adjusting the dose of your diabetic medicine. What side effects may I notice from receiving this medicine? Side effects that you should report to your doctor or health care professional as soon as possible: -allergic reactions like skin rash, itching or hives, swelling of the face, lips, or tongue -changes in vision -fever, sore throat, sneezing, cough, or other signs of infection, wounds that will not heal -increased thirst -mental depression, mood swings, mistaken feelings of self importance or of being mistreated -pain in hips, back, ribs, arms, shoulders, or legs -redness, blistering, peeling or loosening of the skin, including inside the mouth -trouble passing urine or change in the amount of urine -swelling of feet or lower legs -unusual bleeding or bruising Side effects that usually do not require medical attention (report to your doctor or health care professional if they continue or are bothersome): -headache -nausea, vomiting -skin problems, acne, thin and shiny skin -weight gain This list may not describe all possible side effects. Call your doctor for medical advice about side effects. You may report side effects to FDA at 1-800-FDA-1088. Where should I keep my medicine? Keep out of the reach of children. Store at room temperature between 20 and 25 degrees C (68 and 77 degrees F). Protect from light. Throw away any unused medicine after the expiration date. NOTE: This sheet is a summary. It may not cover all possible information. If you have questions about this medicine, talk to your doctor, pharmacist, or health care provider.  2014, Else[employer] Standard. (2008-01-07 14:02:13) Lidocaine; Prilocaine cream What is this medicine? LIDOCAINE; PRILOCAINE (LYE doe kane; PRIL oh kane) is a topical anesthetic that causes loss of feeling in the skin and surrounding tissues. It is used to numb the skin before procedures or injections. This medicine may be used for other purposes; ask  your health care provider or pharmacist if you have questions. COMMON BRAND NAME(S): EMLA What should I tell my health care provider before I take this medicine? They need to know if you have any of these conditions: -glucose-6-phosphate deficiencies -heart disease -kidney or liver disease -methemoglobinemia -an unusual or allergic reaction to lidocaine, prilocaine, other medicines, foods, dyes, or preservatives -pregnant or trying to get pregnant -breast-feeding How should I use this medicine? This medicine is for external use only on the skin. Do not take by mouth. Follow the directions on the prescription label. Wash hands before and after use. Do not use more or leave in contact with the skin longer than directed. Do not apply to eyes or open wounds. It can cause irritation and blurred or temporary loss of vision. If this medicine comes in contact with your eyes, immediately rinse the eye with water. Do not touch or rub the eye. Contact your health care provider right away. Talk to your pediatrician regarding the use of this medicine in children. While this medicine may be prescribed for children for selected conditions, precautions do apply. Overdosage: If you think you have taken too much of this medicine contact a poison control center or emergency room at once. NOTE: This medicine  is only for you. Do not share this medicine with others. What if I miss a dose? This medicine is usually only applied once prior to each procedure. It must be in contact with the skin for a period of time for it to work. If you applied this medicine later than directed, tell your health care professional before starting the procedure. What may interact with this medicine? -acetaminophen -chloroquine -dapsone -medicines to control heart rhythm -nitrates like nitroglycerin and nitroprusside -other ointments, creams, or sprays that may contain anesthetic  medicine -phenobarbital -phenytoin -quinine -sulfonamides like sulfacetamide, sulfamethoxazole, sulfasalazine and others This list may not describe all possible interactions. Give your health care provider a list of all the medicines, herbs, non-prescription drugs, or dietary supplements you use. Also tell them if you smoke, drink alcohol, or use illegal drugs. Some items may interact with your medicine. What should I watch for while using this medicine? Be careful to avoid injury to the treated area while it is numb and you are not aware of pain. Avoid scratching, rubbing, or exposing the treated area to hot or cold temperatures until complete sensation has returned. The numb feeling will wear off a few hours after applying the cream. What side effects may I notice from receiving this medicine? Side effects that you should report to your doctor or health care professional as soon as possible: -blurred vision -chest pain -difficulty breathing -dizziness -drowsiness -fast or irregular heartbeat -skin rash or itching -swelling of your throat, lips, or face -trembling Side effects that usually do not require medical attention (report to your doctor or health care professional if they continue or are bothersome): -changes in ability to feel hot or cold -redness and swelling at the application site This list may not describe all possible side effects. Call your doctor for medical advice about side effects. You may report side effects to FDA at 1-800-FDA-1088. Where should I keep my medicine? Keep out of reach of children. Store at room temperature between 15 and 30 degrees C (59 and 86 degrees F). Keep container tightly closed. Throw away any unused medicine after the expiration date. NOTE: This sheet is a summary. It may not cover all possible information. If you have questions about this medicine, talk to your doctor, pharmacist, or health care provider.  2014, [employer] Standard.  (2008-03-21 17:14:35)

## 2013-10-06 ENCOUNTER — Other Ambulatory Visit (HOSPITAL_COMMUNITY): Payer: Self-pay | Admitting: Hematology and Oncology

## 2013-10-07 ENCOUNTER — Encounter (HOSPITAL_COMMUNITY): Payer: Medicare Other | Attending: Hematology and Oncology

## 2013-10-07 ENCOUNTER — Other Ambulatory Visit (HOSPITAL_COMMUNITY): Payer: Medicare Other

## 2013-10-07 VITALS — Wt 180.0 lb

## 2013-10-07 DIAGNOSIS — C170 Malignant neoplasm of duodenum: Secondary | ICD-10-CM | POA: Insufficient documentation

## 2013-10-07 DIAGNOSIS — K831 Obstruction of bile duct: Secondary | ICD-10-CM

## 2013-10-07 DIAGNOSIS — B370 Candidal stomatitis: Secondary | ICD-10-CM | POA: Insufficient documentation

## 2013-10-07 DIAGNOSIS — I959 Hypotension, unspecified: Secondary | ICD-10-CM | POA: Insufficient documentation

## 2013-10-07 DIAGNOSIS — C801 Malignant (primary) neoplasm, unspecified: Secondary | ICD-10-CM

## 2013-10-07 DIAGNOSIS — E860 Dehydration: Secondary | ICD-10-CM | POA: Insufficient documentation

## 2013-10-07 MED ORDER — OXYCODONE HCL 5 MG PO TABS: 5.0000 mg | ORAL_TABLET | ORAL | Status: DC | PRN

## 2013-10-07 NOTE — Progress Notes (Signed)
Chemo teaching done and consent signed for Oxaliplatin & Xeloda. Med/chemo calender given to patient. Gloves given to patient's wife for safe handling of Xeloda.

## 2013-10-08 ENCOUNTER — Telehealth (HOSPITAL_COMMUNITY): Payer: Self-pay | Admitting: *Deleted

## 2013-10-08 ENCOUNTER — Encounter (HOSPITAL_COMMUNITY): Payer: Self-pay | Admitting: *Deleted

## 2013-10-08 NOTE — Telephone Encounter (Signed)
Dr. Laural Golden to talk to Dr. Jerl Santos gastroenterologist @ [facility] over the weekend or the on call gastroenterologist about getting this patient in to be seen about a possible duodenal stent. Patient's wife notified of this and also notified that to help with patient's acid reflux to start taking Maalox 1-2 tablespoons every 2 hours. She verbalized understanding.

## 2013-10-11 ENCOUNTER — Encounter (HOSPITAL_BASED_OUTPATIENT_CLINIC_OR_DEPARTMENT_OTHER): Payer: Medicare Other

## 2013-10-11 ENCOUNTER — Encounter (HOSPITAL_COMMUNITY): Payer: Self-pay

## 2013-10-11 ENCOUNTER — Other Ambulatory Visit (HOSPITAL_COMMUNITY): Payer: Medicare Other

## 2013-10-11 VITALS — BP 98/57 | HR 104 | Temp 98.0°F | Resp 20 | Wt 173.8 lb

## 2013-10-11 DIAGNOSIS — K831 Obstruction of bile duct: Secondary | ICD-10-CM

## 2013-10-11 DIAGNOSIS — C170 Malignant neoplasm of duodenum: Secondary | ICD-10-CM

## 2013-10-11 DIAGNOSIS — C801 Malignant (primary) neoplasm, unspecified: Secondary | ICD-10-CM

## 2013-10-11 DIAGNOSIS — Z5111 Encounter for antineoplastic chemotherapy: Secondary | ICD-10-CM

## 2013-10-11 LAB — CBC WITH DIFFERENTIAL/PLATELET
BASOS ABS: 0.1 10*3/uL (ref 0.0–0.1)
Basophils Relative: 1 % (ref 0–1)
EOS PCT: 1 % (ref 0–5)
Eosinophils Absolute: 0.2 10*3/uL (ref 0.0–0.7)
HCT: 36.9 % — ABNORMAL LOW (ref 39.0–52.0)
Hemoglobin: 12.7 g/dL — ABNORMAL LOW (ref 13.0–17.0)
Lymphocytes Relative: 21 % (ref 12–46)
Lymphs Abs: 3.2 10*3/uL (ref 0.7–4.0)
MCH: 31.9 pg (ref 26.0–34.0)
MCHC: 34.4 g/dL (ref 30.0–36.0)
MCV: 92.7 fL (ref 78.0–100.0)
Monocytes Absolute: 1.1 10*3/uL — ABNORMAL HIGH (ref 0.1–1.0)
Monocytes Relative: 8 % (ref 3–12)
Neutro Abs: 10.4 10*3/uL — ABNORMAL HIGH (ref 1.7–7.7)
Neutrophils Relative %: 69 % (ref 43–77)
PLATELETS: 557 10*3/uL — AB (ref 150–400)
RBC: 3.98 MIL/uL — ABNORMAL LOW (ref 4.22–5.81)
RDW: 14.9 % (ref 11.5–15.5)
WBC: 15.1 10*3/uL — AB (ref 4.0–10.5)

## 2013-10-11 LAB — COMPREHENSIVE METABOLIC PANEL
ALBUMIN: 2.5 g/dL — AB (ref 3.5–5.2)
ALT: 16 U/L (ref 0–53)
AST: 25 U/L (ref 0–37)
Alkaline Phosphatase: 208 U/L — ABNORMAL HIGH (ref 39–117)
BUN: 29 mg/dL — ABNORMAL HIGH (ref 6–23)
CALCIUM: 9.2 mg/dL (ref 8.4–10.5)
CO2: 25 mEq/L (ref 19–32)
Chloride: 98 mEq/L (ref 96–112)
Creatinine, Ser: 2.26 mg/dL — ABNORMAL HIGH (ref 0.50–1.35)
GFR calc Af Amer: 32 mL/min — ABNORMAL LOW (ref >90)
GFR calc non Af Amer: 28 mL/min — ABNORMAL LOW (ref >90)
Glucose, Bld: 117 mg/dL — ABNORMAL HIGH (ref 70–99)
Potassium: 4.5 mEq/L (ref 3.7–5.3)
Sodium: 136 mEq/L — ABNORMAL LOW (ref 137–147)
TOTAL PROTEIN: 7.4 g/dL (ref 6.0–8.3)
Total Bilirubin: 1 mg/dL (ref 0.3–1.2)

## 2013-10-11 LAB — CANCER ANTIGEN 19-9: CA 19-9: 508.5 U/mL — ABNORMAL HIGH (ref <35.0)

## 2013-10-11 MED ORDER — SODIUM CHLORIDE 0.9 % IV SOLN
150.0000 mg | Freq: Once | INTRAVENOUS | Status: AC
  Administered 2013-10-11: 150 mg via INTRAVENOUS
  Filled 2013-10-11: qty 5

## 2013-10-11 MED ORDER — SODIUM CHLORIDE 0.9 % IJ SOLN
10.0000 mL | INTRAMUSCULAR | Status: DC | PRN
  Administered 2013-10-11: 10 mL

## 2013-10-11 MED ORDER — DEXTROSE 5 % IV SOLN
Freq: Once | INTRAVENOUS | Status: AC
  Administered 2013-10-11: 11:00:00 via INTRAVENOUS

## 2013-10-11 MED ORDER — PALONOSETRON HCL INJECTION 0.25 MG/5ML
0.2500 mg | Freq: Once | INTRAVENOUS | Status: AC
  Administered 2013-10-11: 0.25 mg via INTRAVENOUS
  Filled 2013-10-11: qty 5

## 2013-10-11 MED ORDER — DEXTROSE 5 % IV SOLN
85.0000 mg/m2 | Freq: Once | INTRAVENOUS | Status: AC
  Administered 2013-10-11: 180 mg via INTRAVENOUS
  Filled 2013-10-11: qty 36

## 2013-10-11 MED ORDER — HEPARIN SOD (PORK) LOCK FLUSH 100 UNIT/ML IV SOLN
500.0000 [IU] | Freq: Once | INTRAVENOUS | Status: AC | PRN
  Administered 2013-10-11: 500 [IU]
  Filled 2013-10-11: qty 5

## 2013-10-11 NOTE — Progress Notes (Signed)
Tolerated chemo Rx well.  Alert, in no apparent distress.  Left in c/o spouse for transport home. Informed pt that we would f/u with him tomorrow to see how he is feeling.

## 2013-10-11 NOTE — Patient Instructions (Signed)
[facility] Discharge Instructions  RECOMMENDATIONS MADE BY THE CONSULTANT AND ANY TEST RESULTS WILL BE SENT TO YOUR REFERRING PHYSICIAN.  Begin chemotherapy as planned today. Start Xeloda two times daily for 7 days on and 7 days off. Chemotherapy every 14 days. 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 MD appointment again in 2 weeks when you are back for chemotherapy. Call clinic with any issues/concerns as needed prior to appointments. [phone])  Thank you for choosing [facility] to provide your oncology and hematology care.  To afford each patient quality time with our providers, please arrive at least 15 minutes before your scheduled appointment time.  With your help, our goal is to use those 15 minutes to complete the necessary work-up to ensure our physicians have the information they need to help with your evaluation and healthcare recommendations.    Effective January 1st, 2014, we ask that you re-schedule your appointment with our physicians should you arrive 10 or more minutes late for your appointment.  We strive to give you quality time with our providers, and arriving late affects you and other patients whose appointments are after yours.    Again, thank you for choosing [facility].  Our hope is that these requests will decrease the amount of time that you wait before being seen by our physicians.       _____________________________________________________________  Should you have questions after your visit to [facility], please contact our office at [phone] between the hours of 8:30 a.m. and 5:00 p.m.  Voicemails left after 4:30 p.m. will not be returned until the following business day.  For prescription refill requests, have your pharmacy contact our office with your prescription refill request.

## 2013-10-11 NOTE — Progress Notes (Signed)
[place]  OFFICE PROGRESS NOTE  Bronson Curb, [place] [address] [place] [address]  DIAGNOSIS: Duodenal cancer - Plan: CBC with Differential, Comprehensive metabolic panel, Cancer antigen 19-9  Chief Complaint  Patient presents with  . Cancer of the duodenum    CURRENT THERAPY: To begin chemotherapy today preoperatively.  INTERVAL HISTORY: Trevor Crane 73 y.o. 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 He is able to eat without nausea or vomiting. Visiting nurses are seen in 3 times a week for care of his biliary drain. He denies a diarrhea, constipation, but does have intermittent bouts of melena. He denies any fever, night sweats, cough, wheezing, flank pain, lower extremity swelling or redness, epistaxis, peripheral paresthesias, headache, or seizures.    MEDICAL HISTORY: Past Medical History  Diagnosis Date  . Hypertension   . Hypercholesterolemia   . Panic attacks   . 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    ALLERGIES:  is allergic to sulfa antibiotics.  MEDICATIONS: has a current medication list which includes the following prescription(s): acetaminophen, calcium carbonate, capecitabine, dexamethasone, diphenoxylate-atropine, fluoxetine, fluticasone, lidocaine-prilocaine, lisinopril, loratadine-pseudoephedrine, lorazepam, methylcellulose, metoclopramide, omeprazole, ondansetron, oxycodone, pantoprazole, prochlorperazine, and simvastatin, and the following Facility-Administered Medications: dextrose, heparin lock  flush, oxaliplatin (ELOXATIN) 180 mg in dextrose 5 % 500 mL chemo infusion, and sodium chloride.  SURGICAL HISTORY:  Past Surgical History  Procedure Laterality Date  . Esophagogastroduodenoscopy (egd) with propofol N/A 09/17/2013    Procedure: ESOPHAGOGASTRODUODENOSCOPY (EGD) WITH PROPOFOL;  Surgeon: Rogene Houston, MD;  Location: [facility];  Service: Gastroenterology;  Laterality: N/A;  . Ercp N/A 09/17/2013    Procedure: ATTEMPTED ENDOSCOPIC RETROGRADE CHOLANGIOPANCREATOGRAPHY (ERCP);  Surgeon: Rogene Houston, MD;  Location: [facility];  Service: Gastroenterology;  Laterality: N/A;  . Esophageal biopsy N/A 09/17/2013    Procedure: BIOPSY;  Surgeon: Rogene Houston, MD;  Location: [facility];  Service: Gastroenterology;  Laterality: N/A;    FAMILY HISTORY: family history includes Cancer in his mother; Hypertension in his father and maternal uncle.  SOCIAL HISTORY:  reports that he has never smoked. He has quit using smokeless tobacco. He reports that he drinks about 0.6 ounces of alcohol per week. He reports that he does not use illicit drugs.  REVIEW OF SYSTEMS:  Other than that discussed above is noncontributory.  PHYSICAL EXAMINATION: ECOG PERFORMANCE STATUS: 1 - Symptomatic but completely ambulatory  There were no vitals taken for this visit.  GENERAL:alert, no distress and comfortable SKIN: skin color, texture, turgor are normal, no rashes or significant lesions EYES: PERLA; Conjunctiva are pink and non-injected, nonicteric sclerae. OROPHARYNX:no exudate, no erythema on lips, buccal mucosa, or tongue. NECK: supple, thyroid normal size, non-tender, without nodularity. No masses CHEST: Normal AP diameter with light port in place. LYMPH:  no palpable lymphadenopathy in the cervical, axillary or inguinal LUNGS: clear to auscultation and percussion with normal breathing effort HEART: regular rate & rhythm and no murmurs. ABDOMEN:abdomen soft, non-tender and normal bowel sounds. Right  biliary drainage catheter in place with no evidence of stomal redness or leakage. MUSCULOSKELETAL:no cyanosis of digits and no clubbing. Range of  motion normal.  NEURO: alert & oriented x 3 with fluent speech, no focal motor/sensory deficits   LABORATORY DATA: Infusion on 10/11/2013  Component Date Value Range Status  . WBC 10/11/2013 15.1* 4.0 - 10.5 K/uL Final  . RBC 10/11/2013 3.98* 4.22 - 5.81 MIL/uL Final  . Hemoglobin 10/11/2013 12.7* 13.0 - 17.0 g/dL Final  . HCT 10/11/2013 36.9* 39.0 - 52.0 % Final  . MCV 10/11/2013 92.7  78.0 - 100.0 fL Final  . MCH 10/11/2013 31.9  26.0 - 34.0 pg Final  . MCHC 10/11/2013 34.4  30.0 - 36.0 g/dL Final  . RDW 10/11/2013 14.9  11.5 - 15.5 % Final  . Platelets 10/11/2013 557* 150 - 400 K/uL Final  . Neutrophils Relative % 10/11/2013 69  43 - 77 % Final  . Neutro Abs 10/11/2013 10.4* 1.7 - 7.7 K/uL Final  . Lymphocytes Relative 10/11/2013 21  12 - 46 % Final  . Lymphs Abs 10/11/2013 3.2  0.7 - 4.0 K/uL Final  . Monocytes Relative 10/11/2013 8  3 - 12 % Final  . Monocytes Absolute 10/11/2013 1.1* 0.1 - 1.0 K/uL Final  . Eosinophils Relative 10/11/2013 1  0 - 5 % Final  . Eosinophils Absolute 10/11/2013 0.2  0.0 - 0.7 K/uL Final  . Basophils Relative 10/11/2013 1  0 - 1 % Final  . Basophils Absolute 10/11/2013 0.1  0.0 - 0.1 K/uL Final  . Sodium 10/11/2013 136* 137 - 147 mEq/L Final  . Potassium 10/11/2013 4.5  3.7 - 5.3 mEq/L Final  . Chloride 10/11/2013 98  96 - 112 mEq/L Final  . CO2 10/11/2013 25  19 - 32 mEq/L Final  . Glucose, Bld 10/11/2013 117* 70 - 99 mg/dL Final  . BUN 10/11/2013 29* 6 - 23 mg/dL Final  . Creatinine, Ser 10/11/2013 2.26* 0.50 - 1.35 mg/dL Final  . Calcium 10/11/2013 9.2  8.4 - 10.5 mg/dL Final  . Total Protein 10/11/2013 7.4  6.0 - 8.3 g/dL Final  . Albumin 10/11/2013 2.5* 3.5 - 5.2 g/dL Final  . AST 10/11/2013 25  0 - 37 U/L Final  . ALT 10/11/2013 16  0 - 53 U/L Final  . Alkaline Phosphatase 10/11/2013 208* 39 -  117 U/L Final  . Total Bilirubin 10/11/2013 1.0  0.3 - 1.2 mg/dL Final  . GFR calc non Af Amer 10/11/2013 28* >90 mL/min Final  . GFR calc Af Amer 10/11/2013 32* >90 mL/min Final   Comment: (NOTE)                          The eGFR has been calculated using the CKD EPI equation.                          This calculation has not been validated in all clinical situations.                          eGFR's persistently <90 mL/min signify possible Chronic Kidney                          Disease.  Hospital Outpatient Visit on 10/01/2013  Component Date Value Range Status  . aPTT 10/01/2013 39* 24 - 37 seconds Final   Comment:  IF BASELINE aPTT IS ELEVATED,                          SUGGEST PATIENT RISK ASSESSMENT                          BE USED TO DETERMINE APPROPRIATE                          ANTICOAGULANT THERAPY.  . WBC 10/01/2013 21.4* 4.0 - 10.5 K/uL Final  . RBC 10/01/2013 3.95* 4.22 - 5.81 MIL/uL Final  . Hemoglobin 10/01/2013 12.4* 13.0 - 17.0 g/dL Final  . HCT 10/01/2013 37.6* 39.0 - 52.0 % Final  . MCV 10/01/2013 95.2  78.0 - 100.0 fL Final  . MCH 10/01/2013 31.4  26.0 - 34.0 pg Final  . MCHC 10/01/2013 33.0  30.0 - 36.0 g/dL Final  . RDW 10/01/2013 15.3  11.5 - 15.5 % Final  . Platelets 10/01/2013 654* 150 - 400 K/uL Final  . Prothrombin Time 10/01/2013 14.1  11.6 - 15.2 seconds Final  . INR 10/01/2013 1.11  0.00 - 1.49 Final  Office Visit on 09/28/2013  Component Date Value Range Status  . WBC 09/28/2013 17.5* 4.0 - 10.5 K/uL Final  . RBC 09/28/2013 3.83* 4.22 - 5.81 MIL/uL Final  . Hemoglobin 09/28/2013 12.0* 13.0 - 17.0 g/dL Final  . HCT 09/28/2013 37.2* 39.0 - 52.0 % Final  . MCV 09/28/2013 97.1  78.0 - 100.0 fL Final  . MCH 09/28/2013 31.3  26.0 - 34.0 pg Final  . MCHC 09/28/2013 32.3  30.0 - 36.0 g/dL Final  . RDW 09/28/2013 15.5  11.5 - 15.5 % Final  . Platelets 09/28/2013 612* 150 - 400 K/uL Final  . Neutrophils Relative % 09/28/2013  80* 43 - 77 % Final  . Neutro Abs 09/28/2013 14.0* 1.7 - 7.7 K/uL Final  . Lymphocytes Relative 09/28/2013 10* 12 - 46 % Final  . Lymphs Abs 09/28/2013 1.7  0.7 - 4.0 K/uL Final  . Monocytes Relative 09/28/2013 9  3 - 12 % Final  . Monocytes Absolute 09/28/2013 1.6* 0.1 - 1.0 K/uL Final  . Eosinophils Relative 09/28/2013 1  0 - 5 % Final  . Eosinophils Absolute 09/28/2013 0.1  0.0 - 0.7 K/uL Final  . Basophils Relative 09/28/2013 0  0 - 1 % Final  . Basophils Absolute 09/28/2013 0.0  0.0 - 0.1 K/uL Final  . WBC Morphology 09/28/2013 INCREASED BANDS (>20% BANDS)   Corrected   ATYPICAL LYMPHOCYTES  . RBC Morphology 09/28/2013 POLYCHROMASIA PRESENT   Corrected  . Smear Review 09/28/2013 PLATELET COUNT CONFIRMED BY SMEAR   Corrected   Comment: PLATELETS APPEAR INCREASED                          LARGE PLATELETS PRESENT  . Retic Ct Pct 09/28/2013 4.4* 0.4 - 3.1 % Final  . RBC. 09/28/2013 3.83* 4.22 - 5.81 MIL/uL Final  . Retic Count, Manual 09/28/2013 168.5  19.0 - 186.0 K/uL Final  . Sodium 09/28/2013 136* 137 - 147 mEq/L Final   Please note change in reference range.  . Potassium 09/28/2013 4.7  3.7 - 5.3 mEq/L Final   Please note change in reference range.  . Chloride 09/28/2013 96  96 - 112 mEq/L Final  . CO2 09/28/2013 28  19 - 32 mEq/L Final  . Glucose,  Bld 09/28/2013 131* 70 - 99 mg/dL Final  . BUN 09/28/2013 39* 6 - 23 mg/dL Final  . Creatinine, Ser 09/28/2013 1.74* 0.50 - 1.35 mg/dL Final  . Calcium 09/28/2013 9.4  8.4 - 10.5 mg/dL Final  . Total Protein 09/28/2013 6.5  6.0 - 8.3 g/dL Final  . Albumin 09/28/2013 2.3* 3.5 - 5.2 g/dL Final  . AST 09/28/2013 33  0 - 37 U/L Final  . ALT 09/28/2013 30  0 - 53 U/L Final  . Alkaline Phosphatase 09/28/2013 201* 39 - 117 U/L Final  . Total Bilirubin 09/28/2013 1.9* 0.3 - 1.2 mg/dL Final  . GFR calc non Af Amer 09/28/2013 38* >90 mL/min Final  . GFR calc Af Amer 09/28/2013 44* >90 mL/min Final   Comment: (NOTE)                           The eGFR has been calculated using the CKD EPI equation.                          This calculation has not been validated in all clinical situations.                          eGFR's persistently <90 mL/min signify possible Chronic Kidney                          Disease.  . CEA 09/28/2013 1.6  0.0 - 5.0 ng/mL Final   Performed at [facility]  . CA 19-9 09/28/2013 646.3* <35.0 U/mL Final   Performed at [facility]  Admission on 09/15/2013, Discharged on 09/17/2013  Component Date Value Range Status  . WBC 09/15/2013 10.6* 4.0 - 10.5 K/uL Final  . RBC 09/15/2013 3.92* 4.22 - 5.81 MIL/uL Final  . Hemoglobin 09/15/2013 12.6* 13.0 - 17.0 g/dL Final  . HCT 09/15/2013 38.4* 39.0 - 52.0 % Final  . MCV 09/15/2013 98.0  78.0 - 100.0 fL Final  . MCH 09/15/2013 32.1  26.0 - 34.0 pg Final  . MCHC 09/15/2013 32.8  30.0 - 36.0 g/dL Final  . RDW 09/15/2013 16.4* 11.5 - 15.5 % Final  . Platelets 09/15/2013 489* 150 - 400 K/uL Final  . Neutrophils Relative % 09/15/2013 56  43 - 77 % Final  . Neutro Abs 09/15/2013 5.9  1.7 - 7.7 K/uL Final  . Lymphocytes Relative 09/15/2013 34  12 - 46 % Final  . Lymphs Abs 09/15/2013 3.6  0.7 - 4.0 K/uL Final  . Monocytes Relative 09/15/2013 8  3 - 12 % Final  . Monocytes Absolute 09/15/2013 0.8  0.1 - 1.0 K/uL Final  . Eosinophils Relative 09/15/2013 3  0 - 5 % Final  . Eosinophils Absolute 09/15/2013 0.3  0.0 - 0.7 K/uL Final  . Basophils Relative 09/15/2013 0  0 - 1 % Final  . Basophils Absolute 09/15/2013 0.0  0.0 - 0.1 K/uL Final  . Sodium 09/15/2013 137  135 - 145 mEq/L Final  . Potassium 09/15/2013 3.4* 3.5 - 5.1 mEq/L Final  . Chloride 09/15/2013 101  96 - 112 mEq/L Final  . CO2 09/15/2013 25  19 - 32 mEq/L Final  . Glucose, Bld 09/15/2013 102* 70 - 99 mg/dL Final  . BUN 09/15/2013 24* 6 - 23 mg/dL Final  . Creatinine, Ser 09/15/2013 2.17* 0.50 - 1.35 mg/dL Final  .  Calcium 09/15/2013 9.4  8.4 - 10.5 mg/dL Final  . Total Protein  09/15/2013 7.5  6.0 - 8.3 g/dL Final  . Albumin 09/15/2013 3.0* 3.5 - 5.2 g/dL Final  . AST 09/15/2013 115* 0 - 37 U/L Final  . ALT 09/15/2013 140* 0 - 53 U/L Final  . Alkaline Phosphatase 09/15/2013 544* 39 - 117 U/L Final  . Total Bilirubin 09/15/2013 6.8* 0.3 - 1.2 mg/dL Final  . GFR calc non Af Amer 09/15/2013 29* >90 mL/min Final  . GFR calc Af Amer 09/15/2013 34* >90 mL/min Final   Comment: (NOTE)                          The eGFR has been calculated using the CKD EPI equation.                          This calculation has not been validated in all clinical situations.                          eGFR's persistently <90 mL/min signify possible Chronic Kidney                          Disease.  . Lipase 09/15/2013 56  11 - 59 U/L Final  . Color, Urine 09/15/2013 YELLOW  YELLOW Final  . APPearance 09/15/2013 CLEAR  CLEAR Final  . Specific Gravity, Urine 09/15/2013 1.020  1.005 - 1.030 Final  . pH 09/15/2013 6.0  5.0 - 8.0 Final  . Glucose, UA 09/15/2013 NEGATIVE  NEGATIVE mg/dL Final  . Hgb urine dipstick 09/15/2013 NEGATIVE  NEGATIVE Final  . Bilirubin Urine 09/15/2013 MODERATE* NEGATIVE Final  . Ketones, ur 09/15/2013 NEGATIVE  NEGATIVE mg/dL Final  . Protein, ur 09/15/2013 NEGATIVE  NEGATIVE mg/dL Final  . Urobilinogen, UA 09/15/2013 1.0  0.0 - 1.0 mg/dL Final  . Nitrite 09/15/2013 NEGATIVE  NEGATIVE Final  . Leukocytes, UA 09/15/2013 NEGATIVE  NEGATIVE Final   MICROSCOPIC NOT DONE ON URINES WITH NEGATIVE PROTEIN, BLOOD, LEUKOCYTES, NITRITE, OR GLUCOSE <1000 mg/dL.  Marland Kitchen Prothrombin Time 09/15/2013 13.3  11.6 - 15.2 seconds Final  . INR 09/15/2013 1.03  0.00 - 1.49 Final  . Ammonia 09/15/2013 29  11 - 60 umol/L Final  . Lactic Acid, Venous 09/15/2013 1.4  0.5 - 2.2 mmol/L Final  . Acetaminophen (Tylenol), Serum 09/15/2013 <15.0  10 - 30 ug/mL Final   Comment:                                 THERAPEUTIC CONCENTRATIONS VARY                          SIGNIFICANTLY. A RANGE OF 10-30                           ug/mL MAY BE AN EFFECTIVE                          CONCENTRATION FOR MANY PATIENTS.                          HOWEVER, SOME ARE BEST TREATED  AT CONCENTRATIONS OUTSIDE THIS                          RANGE.                          ACETAMINOPHEN CONCENTRATIONS                          >150 ug/mL AT 4 HOURS AFTER                          INGESTION AND >50 ug/mL AT 12                          HOURS AFTER INGESTION ARE                          OFTEN ASSOCIATED WITH TOXIC                          REACTIONS.  . Salicylate Lvl 44/97/5300 <2.0* 2.8 - 20.0 mg/dL Final  . Alcohol, Ethyl (B) 09/15/2013 <11  0 - 11 mg/dL Final   Comment:                                 LOWEST DETECTABLE LIMIT FOR                          SERUM ALCOHOL IS 11 mg/dL                          FOR MEDICAL PURPOSES ONLY  . Creatinine, Urine 09/16/2013 117.39   Final  . Sodium, Ur 09/16/2013 116   Final  . Color, Urine 09/16/2013 YELLOW  YELLOW Final  . APPearance 09/16/2013 CLEAR  CLEAR Final  . Specific Gravity, Urine 09/16/2013 1.020  1.005 - 1.030 Final  . pH 09/16/2013 6.0  5.0 - 8.0 Final  . Glucose, UA 09/16/2013 NEGATIVE  NEGATIVE mg/dL Final  . Hgb urine dipstick 09/16/2013 NEGATIVE  NEGATIVE Final  . Bilirubin Urine 09/16/2013 MODERATE* NEGATIVE Final  . Ketones, ur 09/16/2013 NEGATIVE  NEGATIVE mg/dL Final  . Protein, ur 09/16/2013 NEGATIVE  NEGATIVE mg/dL Final  . Urobilinogen, UA 09/16/2013 1.0  0.0 - 1.0 mg/dL Final  . Nitrite 09/16/2013 NEGATIVE  NEGATIVE Final  . Leukocytes, UA 09/16/2013 NEGATIVE  NEGATIVE Final   MICROSCOPIC NOT DONE ON URINES WITH NEGATIVE PROTEIN, BLOOD, LEUKOCYTES, NITRITE, OR GLUCOSE <1000 mg/dL.  Marland Kitchen Sodium 09/16/2013 138  135 - 145 mEq/L Final  . Potassium 09/16/2013 3.9  3.5 - 5.1 mEq/L Final  . Chloride 09/16/2013 101  96 - 112 mEq/L Final  . CO2 09/16/2013 26  19 - 32 mEq/L Final  . Glucose, Bld 09/16/2013 118* 70 - 99 mg/dL Final    . BUN 09/16/2013 21  6 - 23 mg/dL Final  . Creatinine, Ser 09/16/2013 1.92* 0.50 - 1.35 mg/dL Final  . Calcium 09/16/2013 9.9  8.4 - 10.5 mg/dL Final  . GFR calc non Af Amer 09/16/2013 34* >90 mL/min Final  . GFR calc Af Amer 09/16/2013 39* >90 mL/min Final   Comment: (NOTE)  The eGFR has been calculated using the CKD EPI equation.                          This calculation has not been validated in all clinical situations.                          eGFR's persistently <90 mL/min signify possible Chronic Kidney                          Disease.  . Sodium 09/17/2013 139  135 - 145 mEq/L Final  . Potassium 09/17/2013 4.3  3.5 - 5.1 mEq/L Final  . Chloride 09/17/2013 106  96 - 112 mEq/L Final  . CO2 09/17/2013 26  19 - 32 mEq/L Final  . Glucose, Bld 09/17/2013 96  70 - 99 mg/dL Final  . BUN 09/17/2013 19  6 - 23 mg/dL Final  . Creatinine, Ser 09/17/2013 1.87* 0.50 - 1.35 mg/dL Final  . Calcium 09/17/2013 9.1  8.4 - 10.5 mg/dL Final  . Total Protein 09/17/2013 6.0  6.0 - 8.3 g/dL Final  . Albumin 09/17/2013 2.5* 3.5 - 5.2 g/dL Final  . AST 09/17/2013 100* 0 - 37 U/L Final  . ALT 09/17/2013 102* 0 - 53 U/L Final  . Alkaline Phosphatase 09/17/2013 449* 39 - 117 U/L Final  . Total Bilirubin 09/17/2013 5.0* 0.3 - 1.2 mg/dL Final  . GFR calc non Af Amer 09/17/2013 35* >90 mL/min Final  . GFR calc Af Amer 09/17/2013 40* >90 mL/min Final   Comment: (NOTE)                          The eGFR has been calculated using the CKD EPI equation.                          This calculation has not been validated in all clinical situations.                          eGFR's persistently <90 mL/min signify possible Chronic Kidney                          Disease.  . WBC 09/17/2013 7.8  4.0 - 10.5 K/uL Final  . RBC 09/17/2013 3.59* 4.22 - 5.81 MIL/uL Final  . Hemoglobin 09/17/2013 11.4* 13.0 - 17.0 g/dL Final  . HCT 09/17/2013 35.1* 39.0 - 52.0 % Final  . MCV 09/17/2013 97.8  78.0 -  100.0 fL Final  . MCH 09/17/2013 31.8  26.0 - 34.0 pg Final  . MCHC 09/17/2013 32.5  30.0 - 36.0 g/dL Final  . RDW 09/17/2013 16.5* 11.5 - 15.5 % Final  . Platelets 09/17/2013 416* 150 - 400 K/uL Final  . Phosphorus 09/17/2013 2.7  2.3 - 4.6 mg/dL Final    PATHOLOGY: No new biology.  Urinalysis    Component Value Date/Time   COLORURINE YELLOW 09/16/2013 1951   APPEARANCEUR CLEAR 09/16/2013 1951   LABSPEC 1.020 09/16/2013 1951   PHURINE 6.0 09/16/2013 1951   GLUCOSEU NEGATIVE 09/16/2013 1951   HGBUR NEGATIVE 09/16/2013 1951   BILIRUBINUR MODERATE* 09/16/2013 [place] NEGATIVE 09/16/2013 1951   PROTEINUR NEGATIVE 09/16/2013 1951   UROBILINOGEN 1.0 09/16/2013 1951   NITRITE NEGATIVE 09/16/2013 1951  LEUKOCYTESUR NEGATIVE 09/16/2013 1951    RADIOGRAPHIC STUDIES: Ct Abdomen Pelvis Wo Contrast  09/15/2013   CLINICAL DATA:  Jaundice  EXAM: CT ABDOMEN AND PELVIS WITHOUT CONTRAST  TECHNIQUE: Multidetector CT imaging of the abdomen and pelvis was performed following the standard protocol without intravenous contrast.  COMPARISON:  None.  FINDINGS: BODY WALL: Fatty right inguinal hernia.  LOWER CHEST: Unremarkable.  ABDOMEN/PELVIS:  Liver: No focal abnormality.  Biliary: Intra and extrahepatic biliary dilatation, with the common bile duct measuring up to 23 mm diameter. The duct is dilated although the way to the papilla, were there is a ill-defined mass nearly filling the the duodenum, 5 cm in length by 2.3 cm in diameter. The posterior margin of the duodenal C-loop has surrounding fat infiltration, possibly involving the proximal upper ureter given moderate right hydronephrosis. No definitive tip adenopathy.  Cholelithiasis.  Pancreas: Appears discrete from the above mass. No ductal dilatation.  Spleen: Unremarkable.  Adrenals: Unremarkable.  Kidneys and ureters: Moderate right hydronephrosis, likely from extrinsic ureteral compression, as above. There is a water density mass exophytic  from the upper pole left kidney, approximately 2 cm. On the right, there is a water density mass exophytic from the interpolar region. There is a soft tissue density mass exophytic from the lower pole right kidney, 2 cm in diameter.  Bladder: Unremarkable.  Reproductive: Unremarkable.  Bowel: Mid duodenal mass as above. Colonic diverticulosis. No bowel obstruction. Normal appendix.  Retroperitoneum: No mass or adenopathy.  Peritoneum: No free fluid or gas.  Vascular: No acute abnormality.  OSSEOUS: No acute abnormalities.  Right-sided L5 pars defect.  IMPRESSION: 1. High-grade biliary obstruction secondary to periampullary region mass, 5 cm in maximal dimension. Endoscopy would likely allow for tissue sampling. There is evidence of extension beyond the duodenum, with involvement of the proximal right ureter - causing moderate hydronephrosis. 2. Indeterminate 2 cm right renal mass, which could be a complicated cyst or solid neoplasm.   Electronically Signed   By: Jorje Guild M.D.   On: 09/15/2013 21:06   US Renal  09/17/2013   CLINICAL DATA:  Hydronephrosis.  EXAM: RENAL/URINARY TRACT ULTRASOUND COMPLETE  COMPARISON:  CT 09/15/2013.  FINDINGS: Right Kidney:  Length: 10.4 cm. Echogenicity is normal. Mild hydronephrosis noted. A 1.2 cm simple cyst is noted mid portion of the kidney corresponding to CT finding. Lower pole right kidney not well visualized. Previously identified possibly solid indeterminate lesion in lower pole right kidney not well visualized.  Left Kidney:  Length: 10.7 cm. Echogenicity within normal limits. No mass or hydronephrosis visualized.  Bladder:  Appears normal for degree of bladder distention. Right ureteral jet not identified.  IMPRESSION: 1. Mild right hydronephrosis. 2. Simple cyst midportion right kidney. Small indeterminate lesion noted in the inferior pole of the right kidney on CT not well identified.   Electronically Signed   By: Marcello Moores  Register   On: 09/17/2013 08:47   Ir  Fluoro Guide Cv Line Right  10/01/2013   CLINICAL DATA:  Duodenal adenocarcinoma, needs access for chemotherapy  EXAM: TUNNELED PORT CATHETER PLACEMENT WITH ULTRASOUND AND FLUOROSCOPIC GUIDANCE  TECHNIQUE: The procedure, risks, benefits, and alternatives were explained to the patient. Questions regarding the procedure were encouraged and answered. The patient understands and consents to the procedure. As antibiotic prophylaxis, cefazolin IV was ordered pre-procedure and administered intravenously within one hour of incision. Patency of the right IJ vein was confirmed with ultrasound with image documentation. An appropriate skin site was determined. Skin site was marked. Region  was prepped using maximum barrier technique including cap and mask, sterile gown, sterile gloves, large sterile sheet, and Chlorhexidine as cutaneous antisepsis. The region was infiltrated locally with 1% lidocaine. Under real-time ultrasound guidance, the right IJ vein was accessed with a 21 gauge micropuncture needle; the needle tip within the vein was confirmed with ultrasound image documentation. Needle was exchanged over a 018 guidewire for transitional dilator which allowed passage of the [facility] wire into the IVC. Over this, the transitional dilator was exchanged for a 5 [place] MPA catheter. A small incision was made on the right anterior chest wall and a subcutaneous pocket fashioned. The power-injectable port was positioned and its catheter tunneled to the right IJ dermatotomy site. The MPA catheter was exchanged over an Amplatz wire for a peel-away sheath, through which the port catheter, which had been trimmed to the appropriate length, was advanced and positioned under fluoroscopy with its tip at the cavoatrial junction. Spot chest radiograph confirms good catheter position and no pneumothorax. The pocket was closed with deep interrupted and subcuticular continuous 3-0 Monocryl sutures. The port was flushed per protocol. The  incisions were covered with Dermabond then covered with a sterile dressing. No immediate complication.  ANESTHESIA/SEDATION: Intravenous Fentanyl and Versed were administered as conscious sedation during continuous cardiorespiratory monitoring by the radiology RN, with a total moderate sedation time of 15 minutes.  FLUOROSCOPY TIME:  6 seconds  IMPRESSION: Technically successful right IJ power-injectable port catheter placement. Ready for routine use.   Electronically Signed   By: Arne Cleveland M.D.   On: 10/01/2013 13:36   Ir US Guide Vasc Access Right  10/01/2013   CLINICAL DATA:  Duodenal adenocarcinoma, needs access for chemotherapy  EXAM: TUNNELED PORT CATHETER PLACEMENT WITH ULTRASOUND AND FLUOROSCOPIC GUIDANCE  TECHNIQUE: The procedure, risks, benefits, and alternatives were explained to the patient. Questions regarding the procedure were encouraged and answered. The patient understands and consents to the procedure. As antibiotic prophylaxis, cefazolin IV was ordered pre-procedure and administered intravenously within one hour of incision. Patency of the right IJ vein was confirmed with ultrasound with image documentation. An appropriate skin site was determined. Skin site was marked. Region was prepped using maximum barrier technique including cap and mask, sterile gown, sterile gloves, large sterile sheet, and Chlorhexidine as cutaneous antisepsis. The region was infiltrated locally with 1% lidocaine. Under real-time ultrasound guidance, the right IJ vein was accessed with a 21 gauge micropuncture needle; the needle tip within the vein was confirmed with ultrasound image documentation. Needle was exchanged over a 018 guidewire for transitional dilator which allowed passage of the [facility] wire into the IVC. Over this, the transitional dilator was exchanged for a 5 [place] MPA catheter. A small incision was made on the right anterior chest wall and a subcutaneous pocket fashioned. The power-injectable port  was positioned and its catheter tunneled to the right IJ dermatotomy site. The MPA catheter was exchanged over an Amplatz wire for a peel-away sheath, through which the port catheter, which had been trimmed to the appropriate length, was advanced and positioned under fluoroscopy with its tip at the cavoatrial junction. Spot chest radiograph confirms good catheter position and no pneumothorax. The pocket was closed with deep interrupted and subcuticular continuous 3-0 Monocryl sutures. The port was flushed per protocol. The incisions were covered with Dermabond then covered with a sterile dressing. No immediate complication.  ANESTHESIA/SEDATION: Intravenous Fentanyl and Versed were administered as conscious sedation during continuous cardiorespiratory monitoring by the radiology RN, with a total moderate  sedation time of 15 minutes.  FLUOROSCOPY TIME:  6 seconds  IMPRESSION: Technically successful right IJ power-injectable port catheter placement. Ready for routine use.   Electronically Signed   By: Arne Cleveland M.D.   On: 10/01/2013 13:36   Dg C-arm 1-60 Min-no Report  09/17/2013   CLINICAL DATA: COMMON BILE DUCT OBSTRUCTION   C-ARM 1-60 MINUTES  Fluoroscopy was utilized by the requesting physician.  No radiographic  interpretation.     ASSESSMENT:  #1. Locally advanced adenocarcinoma of the duodenum, status post percutaneous biliary drain and right hydronephrosis, status post right ureteral stent, stable. #2. Hypertension, controlled. #3. History of basal cell carcinomas of the skin, status post resection, no evidence of recurrence. #4. Gastroesophageal reflux disease, on treatment. #5. Status post life port insertion.   PLAN:  #1. To start Xeloda 1044m TWICE a day for 7 days on and 7 days off with food. Patient was warned about diarrhea and hand-foot syndrome. #2. Oxaliplatin intravenously today. Patient was warned about cold-induced neuropathy. #3. Followup in 2 weeks to receive cycle #2 of  therapy.   All questions were answered. The patient knows to call the clinic with any problems, questions or concerns. We can certainly see the patient much sooner if necessary.   I spent 25 minutes counseling the patient face to face. The total time spent in the appointment was 30 minutes.    FDoroteo Bradford MD 10/11/2013 11:40 AM

## 2013-10-12 ENCOUNTER — Telehealth (HOSPITAL_COMMUNITY): Payer: Self-pay | Admitting: Oncology

## 2013-10-12 NOTE — Telephone Encounter (Signed)
Trevor Crane reports tolerating chemo yesterday very well.  Med regimen for today/tomorrow reviewed.  Verbalized understanding and no questions at this time.

## 2013-10-14 ENCOUNTER — Other Ambulatory Visit (HOSPITAL_COMMUNITY): Payer: Medicare Other

## 2013-10-19 ENCOUNTER — Ambulatory Visit (HOSPITAL_COMMUNITY): Payer: Medicare Other

## 2013-10-21 ENCOUNTER — Telehealth (HOSPITAL_COMMUNITY): Payer: Self-pay | Admitting: *Deleted

## 2013-10-21 NOTE — Telephone Encounter (Signed)
Patient to come in tomorrow (Friday for prn visit). He has a biliary drain that [facility] said needed to be changed every 4-6 weeks. The drain was put in around Christmas. Patient's wife said he is not feeling good at all today. Dr. Barnet Glasgow states that he wants the patient to proceed with chemo on Monday and then we can set him up @ [facility] to have the drain removed and replaced with a new one. Patient's wife states that he isn't drinking much liquids because it is too hard to get it down. She states that his indigestion is bad. She states that she doesn't think he is using the Maalox as Dr. Barnet Glasgow told him to. Patient did not want to come in tomorrow because he felt so bad. I told patient's wife that if he felt that bad that he needed to come in to be checked. She said ok and that she would be here with him in the morning.

## 2013-10-22 ENCOUNTER — Encounter (HOSPITAL_COMMUNITY): Payer: Self-pay | Admitting: Oncology

## 2013-10-22 ENCOUNTER — Encounter (HOSPITAL_BASED_OUTPATIENT_CLINIC_OR_DEPARTMENT_OTHER): Payer: Medicare Other

## 2013-10-22 ENCOUNTER — Encounter (HOSPITAL_COMMUNITY): Payer: Self-pay

## 2013-10-22 ENCOUNTER — Encounter (HOSPITAL_BASED_OUTPATIENT_CLINIC_OR_DEPARTMENT_OTHER): Payer: Medicare Other | Admitting: Oncology

## 2013-10-22 VITALS — BP 72/50 | HR 109 | Temp 97.6°F | Resp 18

## 2013-10-22 VITALS — BP 105/78 | HR 99

## 2013-10-22 DIAGNOSIS — C801 Malignant (primary) neoplasm, unspecified: Secondary | ICD-10-CM

## 2013-10-22 DIAGNOSIS — B370 Candidal stomatitis: Secondary | ICD-10-CM

## 2013-10-22 DIAGNOSIS — K831 Obstruction of bile duct: Secondary | ICD-10-CM

## 2013-10-22 DIAGNOSIS — I959 Hypotension, unspecified: Secondary | ICD-10-CM

## 2013-10-22 DIAGNOSIS — E860 Dehydration: Secondary | ICD-10-CM

## 2013-10-22 DIAGNOSIS — C170 Malignant neoplasm of duodenum: Secondary | ICD-10-CM

## 2013-10-22 LAB — COMPREHENSIVE METABOLIC PANEL
ALBUMIN: 3.1 g/dL — AB (ref 3.5–5.2)
ALK PHOS: 217 U/L — AB (ref 39–117)
ALT: 22 U/L (ref 0–53)
AST: 28 U/L (ref 0–37)
BILIRUBIN TOTAL: 0.9 mg/dL (ref 0.3–1.2)
BUN: 66 mg/dL — ABNORMAL HIGH (ref 6–23)
CHLORIDE: 88 meq/L — AB (ref 96–112)
CO2: 21 mEq/L (ref 19–32)
CREATININE: 5.15 mg/dL — AB (ref 0.50–1.35)
Calcium: 10 mg/dL (ref 8.4–10.5)
GFR calc Af Amer: 12 mL/min — ABNORMAL LOW (ref >90)
GFR calc non Af Amer: 10 mL/min — ABNORMAL LOW (ref >90)
Glucose, Bld: 125 mg/dL — ABNORMAL HIGH (ref 70–99)
POTASSIUM: 5.2 meq/L (ref 3.7–5.3)
SODIUM: 129 meq/L — AB (ref 137–147)
Total Protein: 7.7 g/dL (ref 6.0–8.3)

## 2013-10-22 LAB — CBC WITH DIFFERENTIAL/PLATELET
BASOS ABS: 0.1 10*3/uL (ref 0.0–0.1)
BASOS PCT: 0 % (ref 0–1)
Eosinophils Absolute: 0.2 10*3/uL (ref 0.0–0.7)
Eosinophils Relative: 1 % (ref 0–5)
HCT: 40.5 % (ref 39.0–52.0)
Hemoglobin: 14.1 g/dL (ref 13.0–17.0)
LYMPHS PCT: 9 % — AB (ref 12–46)
Lymphs Abs: 1.8 10*3/uL (ref 0.7–4.0)
MCH: 31.4 pg (ref 26.0–34.0)
MCHC: 34.8 g/dL (ref 30.0–36.0)
MCV: 90.2 fL (ref 78.0–100.0)
Monocytes Absolute: 1.3 10*3/uL — ABNORMAL HIGH (ref 0.1–1.0)
Monocytes Relative: 6 % (ref 3–12)
NEUTROS PCT: 83 % — AB (ref 43–77)
Neutro Abs: 16.6 10*3/uL — ABNORMAL HIGH (ref 1.7–7.7)
PLATELETS: 534 10*3/uL — AB (ref 150–400)
RBC: 4.49 MIL/uL (ref 4.22–5.81)
RDW: 15.7 % — AB (ref 11.5–15.5)
WBC: 19.9 10*3/uL — ABNORMAL HIGH (ref 4.0–10.5)

## 2013-10-22 MED ORDER — FIRST-DUKES MOUTHWASH MT SUSP: 5.0000 mL | Freq: Four times a day (QID) | OROMUCOSAL | Status: DC | PRN

## 2013-10-22 MED ORDER — SODIUM CHLORIDE 0.9 % IV SOLN
INTRAVENOUS | Status: DC
  Administered 2013-10-22: 11:00:00 via INTRAVENOUS
  Filled 2013-10-22 (×10): qty 1000

## 2013-10-22 MED ORDER — HEPARIN SOD (PORK) LOCK FLUSH 100 UNIT/ML IV SOLN
500.0000 [IU] | Freq: Once | INTRAVENOUS | Status: AC
  Administered 2013-10-22: 500 [IU] via INTRAVENOUS

## 2013-10-22 MED ORDER — HEPARIN SOD (PORK) LOCK FLUSH 100 UNIT/ML IV SOLN
INTRAVENOUS | Status: AC
  Filled 2013-10-22: qty 5

## 2013-10-22 MED ORDER — SODIUM CHLORIDE 0.9 % IV SOLN
Freq: Once | INTRAVENOUS | Status: AC
  Administered 2013-10-22: 12:00:00 via INTRAVENOUS

## 2013-10-22 MED ORDER — OXYCODONE HCL 5 MG PO TABS: 5.0000 mg | ORAL_TABLET | ORAL | Status: DC | PRN

## 2013-10-22 NOTE — Progress Notes (Signed)
Notified Kefalas, PA of CMET results.  IVF with K+ discontinued; NS 1L initiated at 235ml/h.

## 2013-10-22 NOTE — Progress Notes (Signed)
Trevor Crane is seen as a work-in today.  His wife called the clinic yesterday afternoon with a few complaints and therefore, he was worked into my scheduled this AM.  Trevor Crane reports that his energy is down and he is not eating.  He denies any nausea or vomiting.  He denies any issues with swallowing.  He denies any mechanical problems with eating, he just notes a decreased appetite.   Secondarily, he does not believe he is consuming enough fluids, although he is trying.  His BP in the clinic today is 72/50 and this is indicative of dehydration.    He denies any mouth pain or sores.  BP 72/50  Pulse 109  Temp(Src) 97.6 F (36.4 C) (Oral)  Resp 18 Gen: Weak and tired appearing.  He is mildly cachectic.  He is not jaundiced HEENT: trachea midline, neck is supple.  Oropharynx: White plaques on buccal mucosa, palate, and postrior pharynx. Cardiac: RRR Lungs: CTA B/L Skin: Warm and dry Extremities: No edema or erythema. Neuro: Alert and oriented x3 without any focal deficits.  Assessment: 1. Dehydration 2. Hypotension, 72/50 3. Oral candidiasis 4. Carcinoma of the duodenum with extrahepatic biliary obstruction, status post percutaneous biliary drainage.  Status post cycle 1 of chemotherapy due to embark on cycle 2 on Monday, 1/26 5. Decreased appetite, likely multifactorial  Plan:  1. Patient education regarding dehydration 2. Patient education regarding hypotension and how it correlates with dehydration 3. Patient education regarding oral candidiasis and its potential causes in his particular situation, namely chemotherapy induced 4. Patient education regarding decreased appetite 5. Laboratory work today: CBC differential, complete metabolic panel 6. Refill on opiate medicine 7. Warm ginger ale provided the patient's request 8. Prescription for Dukes Magic mouthwash, 5 ML 4 times daily as needed for oral candidiasis 9. IV fluids today, normal saline with 30 mEq of potassium 10.  Return as scheduled on Monday for chemotherapy. His appetite continues to be diminished, will consider Megace versus Marinol for appetite stimulation.  On Monday we will arrange for interventional radiology referral for percutaneous biliary drainage exchange.  All questions were answered. Patient is call the clinic with any problems, questions, or concerns.  Patient and plan discussed with Dr. Farrel Gobble and he is in agreement with the aforementioned.   Trevor Crane

## 2013-10-22 NOTE — Patient Instructions (Signed)
[facility] Discharge Instructions  RECOMMENDATIONS MADE BY THE CONSULTANT AND ANY TEST RESULTS WILL BE SENT TO YOUR REFERRING PHYSICIAN.  MEDICATIONS PRESCRIBED:  Refill on pain medication Duke's Magic Mouthwash for oral thrush.  Use 4 times per day as needed.  When thrush resolves, continue for 2-3 days longer and then put the prescription in a cabinet for future use if needed.   INSTRUCTIONS GIVEN AND DISCUSSED: We will administer IV fluids today  SPECIAL INSTRUCTIONS/FOLLOW-UP: Return on Monday for planned chemotherapy.  Start Xeloda on Monday AM if feeling well/better.  If not feeling well, hold Xeloda until seen in the office (and bring the Xeloda prescription with you if it is not taken in the AM before appointment).  Thank you for choosing [facility] to provide your oncology and hematology care.  To afford each patient quality time with our providers, please arrive at least 15 minutes before your scheduled appointment time.  With your help, our goal is to use those 15 minutes to complete the necessary work-up to ensure our physicians have the information they need to help with your evaluation and healthcare recommendations.    Effective January 1st, 2014, we ask that you re-schedule your appointment with our physicians should you arrive 10 or more minutes late for your appointment.  We strive to give you quality time with our providers, and arriving late affects you and other patients whose appointments are after yours.    Again, thank you for choosing [facility]  Our hope is that these requests will decrease the amount of time that you wait before being seen by our physicians.       _____________________________________________________________  Should you have questions after your visit to [facility], please contact our office at [phone] between the hours of 8:30 a.m. and 5:00 p.m.  Voicemails left after 4:30 p.m.  will not be returned until the following business day.  For prescription refill requests, have your pharmacy contact our office with your prescription refill request.

## 2013-10-22 NOTE — Progress Notes (Signed)
Patient resting comfortably. Encourage to drink fluids, only taking small quantity in. VSS on d/c. Patient was given instructions that if he is unable to take in at least 6-8 glasses of liquid a day or if he has nausea, vomiting or diarrhea he is to come to ED. Verbalized understanding.

## 2013-10-24 NOTE — Progress Notes (Signed)
-  No show, reported to ED-

## 2013-10-25 ENCOUNTER — Ambulatory Visit (HOSPITAL_COMMUNITY): Payer: Medicare Other | Admitting: Oncology

## 2013-10-25 ENCOUNTER — Inpatient Hospital Stay (HOSPITAL_COMMUNITY)
Admission: EM | Admit: 2013-10-25 | Discharge: 2013-10-27 | DRG: 871 | Disposition: A | Discharge status: Home Health | Payer: Medicare Other | Attending: Internal Medicine | Admitting: Internal Medicine

## 2013-10-25 ENCOUNTER — Encounter (HOSPITAL_COMMUNITY): Payer: Self-pay | Admitting: Emergency Medicine

## 2013-10-25 ENCOUNTER — Telehealth (HOSPITAL_COMMUNITY): Payer: Self-pay

## 2013-10-25 ENCOUNTER — Emergency Department (HOSPITAL_COMMUNITY): Payer: Medicare Other

## 2013-10-25 ENCOUNTER — Inpatient Hospital Stay (HOSPITAL_COMMUNITY): Payer: Medicare Other

## 2013-10-25 DIAGNOSIS — I10 Essential (primary) hypertension: Secondary | ICD-10-CM | POA: Diagnosis present

## 2013-10-25 DIAGNOSIS — Z79899 Other long term (current) drug therapy: Secondary | ICD-10-CM

## 2013-10-25 DIAGNOSIS — C801 Malignant (primary) neoplasm, unspecified: Secondary | ICD-10-CM

## 2013-10-25 DIAGNOSIS — Z85828 Personal history of other malignant neoplasm of skin: Secondary | ICD-10-CM

## 2013-10-25 DIAGNOSIS — N2889 Other specified disorders of kidney and ureter: Secondary | ICD-10-CM | POA: Diagnosis present

## 2013-10-25 DIAGNOSIS — N183 Chronic kidney disease, stage 3 unspecified: Secondary | ICD-10-CM | POA: Diagnosis present

## 2013-10-25 DIAGNOSIS — A419 Sepsis, unspecified organism: Principal | ICD-10-CM | POA: Diagnosis present

## 2013-10-25 DIAGNOSIS — F410 Panic disorder [episodic paroxysmal anxiety] without agoraphobia: Secondary | ICD-10-CM | POA: Diagnosis present

## 2013-10-25 DIAGNOSIS — E875 Hyperkalemia: Secondary | ICD-10-CM | POA: Diagnosis not present

## 2013-10-25 DIAGNOSIS — K831 Obstruction of bile duct: Secondary | ICD-10-CM

## 2013-10-25 DIAGNOSIS — R627 Adult failure to thrive: Secondary | ICD-10-CM | POA: Diagnosis present

## 2013-10-25 DIAGNOSIS — Z87891 Personal history of nicotine dependence: Secondary | ICD-10-CM

## 2013-10-25 DIAGNOSIS — N170 Acute kidney failure with tubular necrosis: Secondary | ICD-10-CM | POA: Diagnosis present

## 2013-10-25 DIAGNOSIS — N189 Chronic kidney disease, unspecified: Secondary | ICD-10-CM

## 2013-10-25 DIAGNOSIS — I129 Hypertensive chronic kidney disease with stage 1 through stage 4 chronic kidney disease, or unspecified chronic kidney disease: Secondary | ICD-10-CM | POA: Diagnosis present

## 2013-10-25 DIAGNOSIS — E41 Nutritional marasmus: Secondary | ICD-10-CM | POA: Diagnosis present

## 2013-10-25 DIAGNOSIS — N1330 Unspecified hydronephrosis: Secondary | ICD-10-CM | POA: Diagnosis present

## 2013-10-25 DIAGNOSIS — E872 Acidosis, unspecified: Secondary | ICD-10-CM | POA: Diagnosis not present

## 2013-10-25 DIAGNOSIS — Z9221 Personal history of antineoplastic chemotherapy: Secondary | ICD-10-CM

## 2013-10-25 DIAGNOSIS — I959 Hypotension, unspecified: Secondary | ICD-10-CM

## 2013-10-25 DIAGNOSIS — Z8249 Family history of ischemic heart disease and other diseases of the circulatory system: Secondary | ICD-10-CM

## 2013-10-25 DIAGNOSIS — E869 Volume depletion, unspecified: Secondary | ICD-10-CM | POA: Diagnosis present

## 2013-10-25 DIAGNOSIS — C170 Malignant neoplasm of duodenum: Secondary | ICD-10-CM | POA: Diagnosis present

## 2013-10-25 DIAGNOSIS — E43 Unspecified severe protein-calorie malnutrition: Secondary | ICD-10-CM | POA: Diagnosis present

## 2013-10-25 DIAGNOSIS — N390 Urinary tract infection, site not specified: Secondary | ICD-10-CM | POA: Diagnosis present

## 2013-10-25 DIAGNOSIS — R17 Unspecified jaundice: Secondary | ICD-10-CM

## 2013-10-25 DIAGNOSIS — E860 Dehydration: Secondary | ICD-10-CM | POA: Diagnosis present

## 2013-10-25 DIAGNOSIS — E861 Hypovolemia: Secondary | ICD-10-CM | POA: Diagnosis present

## 2013-10-25 DIAGNOSIS — E7800 Pure hypercholesterolemia, unspecified: Secondary | ICD-10-CM | POA: Diagnosis present

## 2013-10-25 DIAGNOSIS — T451X5A Adverse effect of antineoplastic and immunosuppressive drugs, initial encounter: Secondary | ICD-10-CM | POA: Diagnosis present

## 2013-10-25 DIAGNOSIS — B370 Candidal stomatitis: Secondary | ICD-10-CM | POA: Diagnosis present

## 2013-10-25 DIAGNOSIS — D649 Anemia, unspecified: Secondary | ICD-10-CM | POA: Diagnosis present

## 2013-10-25 DIAGNOSIS — E871 Hypo-osmolality and hyponatremia: Secondary | ICD-10-CM | POA: Diagnosis present

## 2013-10-25 DIAGNOSIS — R6520 Severe sepsis without septic shock: Secondary | ICD-10-CM

## 2013-10-25 DIAGNOSIS — Z800 Family history of malignant neoplasm of digestive organs: Secondary | ICD-10-CM

## 2013-10-25 DIAGNOSIS — N179 Acute kidney failure, unspecified: Secondary | ICD-10-CM | POA: Diagnosis present

## 2013-10-25 DIAGNOSIS — C241 Malignant neoplasm of ampulla of Vater: Secondary | ICD-10-CM | POA: Diagnosis present

## 2013-10-25 DIAGNOSIS — Z66 Do not resuscitate: Secondary | ICD-10-CM | POA: Diagnosis present

## 2013-10-25 HISTORY — DX: Hiccough: R06.6

## 2013-10-25 HISTORY — DX: Malignant neoplasm of duodenum: C17.0

## 2013-10-25 HISTORY — DX: Candidal stomatitis: B37.0

## 2013-10-25 HISTORY — DX: Unspecified severe protein-calorie malnutrition: E43

## 2013-10-25 LAB — COMPREHENSIVE METABOLIC PANEL
ALT: 16 U/L (ref 0–53)
AST: 20 U/L (ref 0–37)
Albumin: 2.4 g/dL — ABNORMAL LOW (ref 3.5–5.2)
Alkaline Phosphatase: 148 U/L — ABNORMAL HIGH (ref 39–117)
BUN: 94 mg/dL — ABNORMAL HIGH (ref 6–23)
CO2: 20 mEq/L (ref 19–32)
Calcium: 8.1 mg/dL — ABNORMAL LOW (ref 8.4–10.5)
Chloride: 92 mEq/L — ABNORMAL LOW (ref 96–112)
Creatinine, Ser: 5.77 mg/dL — ABNORMAL HIGH (ref 0.50–1.35)
GFR calc Af Amer: 10 mL/min — ABNORMAL LOW (ref >90)
GFR calc non Af Amer: 9 mL/min — ABNORMAL LOW (ref >90)
Glucose, Bld: 105 mg/dL — ABNORMAL HIGH (ref 70–99)
Potassium: 4.8 mEq/L (ref 3.7–5.3)
Sodium: 131 mEq/L — ABNORMAL LOW (ref 137–147)
Total Bilirubin: 0.6 mg/dL (ref 0.3–1.2)
Total Protein: 5.9 g/dL — ABNORMAL LOW (ref 6.0–8.3)

## 2013-10-25 LAB — URINALYSIS, ROUTINE W REFLEX MICROSCOPIC
Glucose, UA: NEGATIVE mg/dL
Nitrite: NEGATIVE
Protein, ur: 100 mg/dL — AB
Specific Gravity, Urine: >1.03 — ABNORMAL HIGH (ref 1.005–1.030)
Urobilinogen, UA: 0.2 mg/dL (ref 0.0–1.0)
pH: 5 (ref 5.0–8.0)

## 2013-10-25 LAB — URINE MICROSCOPIC-ADD ON

## 2013-10-25 LAB — CBC WITH DIFFERENTIAL/PLATELET
Basophils Absolute: 0 10*3/uL (ref 0.0–0.1)
Basophils Relative: 0 % (ref 0–1)
Eosinophils Absolute: 0.2 10*3/uL (ref 0.0–0.7)
Eosinophils Relative: 2 % (ref 0–5)
HCT: 33.4 % — ABNORMAL LOW (ref 39.0–52.0)
Hemoglobin: 11.6 g/dL — ABNORMAL LOW (ref 13.0–17.0)
Lymphocytes Relative: 11 % — ABNORMAL LOW (ref 12–46)
Lymphs Abs: 1.5 10*3/uL (ref 0.7–4.0)
MCH: 31.4 pg (ref 26.0–34.0)
MCHC: 34.7 g/dL (ref 30.0–36.0)
MCV: 90.3 fL (ref 78.0–100.0)
Monocytes Absolute: 0.9 10*3/uL (ref 0.1–1.0)
Monocytes Relative: 7 % (ref 3–12)
Neutro Abs: 11.1 10*3/uL — ABNORMAL HIGH (ref 1.7–7.7)
Neutrophils Relative %: 81 % — ABNORMAL HIGH (ref 43–77)
Platelets: 369 10*3/uL (ref 150–400)
RBC: 3.7 MIL/uL — ABNORMAL LOW (ref 4.22–5.81)
RDW: 16.1 % — ABNORMAL HIGH (ref 11.5–15.5)
WBC: 13.8 10*3/uL — ABNORMAL HIGH (ref 4.0–10.5)

## 2013-10-25 LAB — LIPASE, BLOOD: Lipase: 61 U/L — ABNORMAL HIGH (ref 11–59)

## 2013-10-25 LAB — MRSA PCR SCREENING: MRSA by PCR: NEGATIVE

## 2013-10-25 LAB — LACTIC ACID, PLASMA: Lactic Acid, Venous: 1.2 mmol/L (ref 0.5–2.2)

## 2013-10-25 MED ORDER — MAGIC MOUTHWASH
5.0000 mL | Freq: Four times a day (QID) | ORAL | Status: DC | PRN
  Filled 2013-10-25: qty 5

## 2013-10-25 MED ORDER — POLYVINYL ALCOHOL 1.4 % OP SOLN
OPHTHALMIC | Status: AC
  Filled 2013-10-25: qty 15

## 2013-10-25 MED ORDER — FLUTICASONE PROPIONATE 50 MCG/ACT NA SUSP
1.0000 | Freq: Every day | NASAL | Status: DC | PRN
  Filled 2013-10-25: qty 16

## 2013-10-25 MED ORDER — FLUOXETINE HCL 20 MG PO CAPS
20.0000 mg | ORAL_CAPSULE | Freq: Every morning | ORAL | Status: DC
  Administered 2013-10-26 – 2013-10-27 (×2): 20 mg via ORAL
  Filled 2013-10-25 (×4): qty 1

## 2013-10-25 MED ORDER — METHYLCELLULOSE 1 % OP SOLN
1.0000 [drp] | Freq: Two times a day (BID) | OPHTHALMIC | Status: DC | PRN
Start: 2013-10-25 — End: 2013-10-25

## 2013-10-25 MED ORDER — SIMVASTATIN 20 MG PO TABS
40.0000 mg | ORAL_TABLET | Freq: Every day | ORAL | Status: DC
  Administered 2013-10-25 – 2013-10-26 (×2): 40 mg via ORAL
  Filled 2013-10-25 (×2): qty 2
  Filled 2013-10-25 (×2): qty 1

## 2013-10-25 MED ORDER — DEXAMETHASONE SODIUM PHOSPHATE 4 MG/ML IJ SOLN
4.0000 mg | Freq: Once | INTRAMUSCULAR | Status: AC
  Administered 2013-10-25: 4 mg via INTRAVENOUS
  Filled 2013-10-25: qty 1

## 2013-10-25 MED ORDER — SODIUM CHLORIDE 0.9 % IV BOLUS (SEPSIS)
1000.0000 mL | Freq: Once | INTRAVENOUS | Status: AC
  Administered 2013-10-25: 1000 mL via INTRAVENOUS

## 2013-10-25 MED ORDER — ONDANSETRON HCL 4 MG/2ML IJ SOLN: 4.0000 mg | Freq: Four times a day (QID) | INTRAMUSCULAR | Status: DC | PRN

## 2013-10-25 MED ORDER — ONDANSETRON HCL 4 MG PO TABS: 4.0000 mg | ORAL_TABLET | Freq: Four times a day (QID) | ORAL | Status: DC | PRN

## 2013-10-25 MED ORDER — FLUTICASONE PROPIONATE 50 MCG/ACT NA SUSP
NASAL | Status: AC
  Filled 2013-10-25: qty 16

## 2013-10-25 MED ORDER — PROCHLORPERAZINE MALEATE 5 MG PO TABS: 10.0000 mg | ORAL_TABLET | Freq: Four times a day (QID) | ORAL | Status: DC | PRN

## 2013-10-25 MED ORDER — DEXTROSE 5 % IV SOLN
1.0000 g | INTRAVENOUS | Status: DC
  Administered 2013-10-26: 1 g via INTRAVENOUS
  Filled 2013-10-25: qty 10

## 2013-10-25 MED ORDER — DEXTROSE 5 % IV SOLN
1.0000 g | Freq: Once | INTRAVENOUS | Status: AC
  Administered 2013-10-25: 1 g via INTRAVENOUS
  Filled 2013-10-25: qty 10

## 2013-10-25 MED ORDER — METOCLOPRAMIDE HCL 10 MG PO TABS: 5.0000 mg | ORAL_TABLET | Freq: Three times a day (TID) | ORAL | Status: DC | PRN

## 2013-10-25 MED ORDER — SODIUM CHLORIDE 0.9 % IV SOLN
INTRAVENOUS | Status: DC
  Administered 2013-10-25 (×3): via INTRAVENOUS

## 2013-10-25 MED ORDER — ALUM & MAG HYDROXIDE-SIMETH 200-200-20 MG/5ML PO SUSP: 30.0000 mL | Freq: Four times a day (QID) | ORAL | Status: DC | PRN

## 2013-10-25 MED ORDER — ENOXAPARIN SODIUM 40 MG/0.4ML ~~LOC~~ SOLN
40.0000 mg | SUBCUTANEOUS | Status: DC
  Administered 2013-10-25: 40 mg via SUBCUTANEOUS
  Filled 2013-10-25: qty 0.4

## 2013-10-25 MED ORDER — POLYVINYL ALCOHOL 1.4 % OP SOLN
1.0000 [drp] | OPHTHALMIC | Status: DC | PRN
Start: 2013-10-25 — End: 2013-10-27
  Filled 2013-10-25: qty 15

## 2013-10-25 MED ORDER — ACETAMINOPHEN 650 MG RE SUPP: 650.0000 mg | Freq: Four times a day (QID) | RECTAL | Status: DC | PRN

## 2013-10-25 MED ORDER — PANTOPRAZOLE SODIUM 40 MG PO TBEC
40.0000 mg | DELAYED_RELEASE_TABLET | Freq: Every day | ORAL | Status: DC
  Administered 2013-10-25 – 2013-10-27 (×3): 40 mg via ORAL
  Filled 2013-10-25 (×3): qty 1

## 2013-10-25 MED ORDER — SODIUM CHLORIDE 0.9 % IV SOLN: INTRAVENOUS | Status: DC

## 2013-10-25 MED ORDER — OXYCODONE HCL 5 MG PO TABS: 5.0000 mg | ORAL_TABLET | ORAL | Status: DC | PRN

## 2013-10-25 MED ORDER — MAGIC MOUTHWASH
ORAL | Status: AC
  Filled 2013-10-25: qty 5

## 2013-10-25 MED ORDER — LORATADINE 10 MG PO TABS
10.0000 mg | ORAL_TABLET | Freq: Every day | ORAL | Status: DC
Start: 2013-10-25 — End: 2013-10-27
  Administered 2013-10-25 – 2013-10-27 (×3): 10 mg via ORAL
  Filled 2013-10-25 (×3): qty 1

## 2013-10-25 MED ORDER — ACETAMINOPHEN 325 MG PO TABS: 650.0000 mg | ORAL_TABLET | Freq: Four times a day (QID) | ORAL | Status: DC | PRN

## 2013-10-25 MED ORDER — FIRST-DUKES MOUTHWASH MT SUSP: 5.0000 mL | Freq: Four times a day (QID) | OROMUCOSAL | Status: DC | PRN

## 2013-10-25 NOTE — H&P (Addendum)
Patient seen, independently examined and chart reviewed. I agree with exam, assessment and plan discussed with Dyanne Carrel, NP.  73 year old man with carcinoma of the duodenum resulting in extrahepatic biliary obstruction (status post percutaneous biliary drain) and right-sided hydronephrosis (status post ureteral stent) who had his first infusion of chemotherapy 1/12. Several days afterwards he developed failure to thrive, malaise, anorexia. He has had very little oral intake for a week or longer. Seen in clinic 1/23 at that time, noted to be hypotensive with systolic blood pressure 72, dehydrated. At that time treated with IV fluids and released home. He felt better for 24-36 hours but subsequently became weaker and did not eat anything. He has been continuing to take his medications except for today. He was scheduled for chemotherapy again today 1/26 but was too weak to stand she came to the emergency department. He has no systemic complaints, no chest pain, shortness of breath, fever, abdominal pain, back pain, extremity pain. Only other complaint is dysuria.  In the emergency department he was found to be profoundly hypotensive with systolic blood pressure in the 70s, was treated with aggressive volume resuscitation greater than 2 L and referred for admission. Vitals otherwise are stable. Review of his laboratory studies is notable for worsening acute renal failure with prerenal pattern superimposed on chronic kidney disease stage III with baseline creatinine approximately 1.7-2. Leukocytosis has improved over the last several weeks. Lactic acid was unremarkable. Urinalysis was grossly positive. Blood cultures and urine culture are pending. Chest x-ray unremarkable.  On exam he appears calm and comfortable lying in bed, appears acutely ill but not toxic. His speech is fluent and clear although somewhat confused and his answers are sometimes inappropriate. He is oriented to himself and location however.  Eyes are grossly unremarkable. ENT appears unremarkable. Cardiovascular regular rate and rhythm. No murmur, rub or gallop. No lower extremity edema. Respiratory is clear to auscultation bilaterally. No wheezes, rales or rhonchi. Normal respiratory effort. Abdomen soft, nontender, nondistended. The biliary drain is in place, there is some pink tissue around the drain but there is no evidence of infection. His hands and feet are warm and dry with grossly normal perfusion. There is no mottling.  He feels somewhat better the emergency department with fluids. He has been started on antibiotics.  His history and clinical presentation is most suggestive of profound volume depletion secondary to anorexia for 7 or more days related to chemotherapy with superimposed UTI. Lactic acid is normal and clinically he does not appear septic, appearing better than one would expect based on his blood pressure however he is immunocompromised and sepsis a consideration. I explained to his wife that he is critically ill, prognosis is guarded and will be admitted to the ICU area.   Acute renal failure superimposed on chronic kidney disease stage III. Potassium normal.  Profound volume depletion  UTI with possible sepsis  Recent chemotherapy   Patient and wife report DNR/DO NOT INTUBATE.  Vasopressors acceptable  Plan  Empiric antibiotics  Aggressive IV fluids, follow urine output closely  Decadron times one, check random cortisol level, stress dose steroids if positive.  Check baseline EKG although he has no cardiac symptoms   Murray Hodgkins, MD [facility] [phone]

## 2013-10-25 NOTE — ED Notes (Signed)
Pt is taking chemo . Pt is hypotensive, 82/44 en route. Pt c/o weakness at home and called MD, was told to come to ED to receive fluids. Fluids initiated by EMS en route and received ~

## 2013-10-25 NOTE — Telephone Encounter (Signed)
Call from Mrs. Merrick, states "Trevor Crane has not felt good this weekend and is not drinking as much as he should.  He has an appointment there this morning and I can't get him going to get there.  Says he just doesn't feel like coming."  Encouraged to try to get him to either come into the clinic for evaluation or if she can't get him in by car to call EMS and  go to the ED for assistance.  Wife will let us know what he's willing to do.

## 2013-10-25 NOTE — H&P (Signed)
[facility] History and Physical  Trevor Crane [id number] DOB: [birth year] DOA: 10/25/2013  Referring physician:  PCP: No PCP Per Patient   Chief Complaint: weakness, hypotension  HPI: Trevor Crane is a 73 y.o. 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 On evaluation in the emergency department blood pressure was found to be 76/40 with a heart rate of 89 and urine was consistent with a urinary tract infection.  Information is obtained from the patient and his wife is at the bedside. Patient underwent his chemotherapy last week as scheduled. He was seen in the clinic on that day and found to be hypotensive and dehydrated. He was given a liter of fluid and he felt much better. He states he did well for one day quickly developed generalized weakness. He denies any abdominal pain nausea vomiting diarrhea. He does have a biliary drain which he says has been draining moderate amounts since its insertion over a month ago. Associated symptoms do include anorexia and hiccups. He denies chest pain palpitation headache dizziness. He denies fever chills or recent sick contacts. He was scheduled to be seen for chemotherapy today but was unable to stand up to get to the doctor's office. EMS was called and he was transported to the emergency room. In the emergency room he was given 3 L of normal saline intravenously as well as Rocephin. At the time of my exam his blood pressure was 81/36. We are asked to admit   Review of Systems:  10 point review of systems completed and all systems are negative except as indicated in the history of present illness  Past Medical History  Diagnosis Date  . Hypertension   .  Hypercholesterolemia   . Panic attacks   . 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 Duodenal cancer   . Oral candidiasis   . Hiccups    Past Surgical History  Procedure Laterality Date  . Esophagogastroduodenoscopy (egd) with propofol N/A 09/17/2013    Procedure: ESOPHAGOGASTRODUODENOSCOPY (EGD) WITH PROPOFOL;  Surgeon: Rogene Houston, MD;  Location: [facility];  Service: Gastroenterology;  Laterality: N/A;  . Ercp N/A 09/17/2013    Procedure: ATTEMPTED ENDOSCOPIC RETROGRADE CHOLANGIOPANCREATOGRAPHY (ERCP);  Surgeon: Rogene Houston, MD;  Location: [facility];  Service: Gastroenterology;  Laterality: N/A;  . Esophageal biopsy N/A 09/17/2013    Procedure: BIOPSY;  Surgeon: Rogene Houston, MD;  Location: [facility];  Service: Gastroenterology;  Laterality: N/A;   Social History:  reports that he has never smoked. He has quit using smokeless tobacco. He reports that he drinks about 0.6 ounces of alcohol per week. He reports that he does not use illicit drugs. Is independent with ADLs. 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 He is married and lives with his wife Allergies  Allergen Reactions  . Sulfa Antibiotics Itching and Rash    Family History  Problem Relation Age of Onset  . Cancer Mother     colon cancer  . Hypertension Father   . Hypertension Maternal Uncle      Prior to Admission medications   Medication Sig Start Date End Date Taking? Authorizing Provider  acetaminophen (TYLENOL) 325 MG tablet Take 325 mg by mouth every 6 (six) hours as needed for mild pain or moderate  pain.   Yes Historical Provider, MD  calcium carbonate (TUMS - DOSED IN MG ELEMENTAL CALCIUM) 500 MG chewable tablet Chew 1 tablet by mouth 3 (three) times daily as needed for indigestion or heartburn.    Yes Historical Provider, MD  capecitabine (XELODA) 500 MG tablet Take 1,000 mg/m2 by mouth daily. 1000mg  twice a day with food for 7 days on, 7 days off for 6 cycles 10/05/13  Yes Farrel Gobble, MD   dexamethasone (DECADRON) 4 MG tablet Starting the day after chemo, take 2 tablets in the am and 2 tablets in the pm for 2 days. Then Stop. Take with food. 10/05/13  Yes Farrel Gobble, MD  Diphenhyd-Hydrocort-Nystatin (FIRST-DUKES MOUTHWASH) SUSP Use as directed 5 mLs in the mouth or throat 4 (four) times daily as needed. 10/22/13  Yes Manon Hilding Kefalas, PA-C  diphenoxylate-atropine (LOMOTIL) 2.5-0.025 MG per tablet Take 1 tablet by mouth 4 (four) times daily as needed for diarrhea or loose stools. 10/05/13  Yes Farrel Gobble, MD  FLUoxetine (PROZAC) 20 MG capsule Take 20 mg by mouth every morning.    Yes Historical Provider, MD  fluticasone (FLONASE) 50 MCG/ACT nasal spray Place 1 spray into both nostrils daily as needed for allergies or rhinitis.   Yes Historical Provider, MD  lidocaine-prilocaine (EMLA) cream Apply a quarter size amount to port site 1 hour prior to chemo. Do not rub in. Cover with plastic wrap. 10/05/13  Yes Farrel Gobble, MD  lisinopril (PRINIVIL,ZESTRIL) 5 MG tablet Take 5 mg by mouth every morning.    Yes Historical Provider, MD  Loratadine-Pseudoephedrine (CLARITIN-D 24 HOUR PO) Take 0.5 tablets by mouth as needed.    Yes Historical Provider, MD  LORazepam (ATIVAN) 1 MG tablet Take 1 mg by mouth 3 (three) times daily as needed for anxiety.   Yes Historical Provider, MD  methylcellulose (ARTIFICIAL TEARS) 1 % ophthalmic solution Place 1 drop into both eyes 2 (two) times daily as needed (dry eyes).   Yes Historical Provider, MD  metoCLOPramide (REGLAN) 5 MG tablet Starting the day after chemo, take 1 tablet four times a day x 48 hours. Then may take 1 tablet four times a day IF needed for nausea/vomiting. 10/05/13  Yes Farrel Gobble, MD  omeprazole (PRILOSEC) 20 MG capsule Take 20 mg by mouth daily.   Yes Historical Provider, MD  ondansetron (ZOFRAN) 4 MG tablet Take 4 mg by mouth every 8 (eight) hours as needed for nausea or vomiting.   Yes Historical Provider, MD  oxyCODONE  (OXY IR/ROXICODONE) 5 MG immediate release tablet Take 1-2 tablets (5-10 mg total) by mouth every 4 (four) hours as needed for severe pain. 10/22/13  Yes Manon Hilding Kefalas, PA-C  pantoprazole (PROTONIX) 40 MG tablet Take 40 mg by mouth daily.   Yes Historical Provider, MD  prochlorperazine (COMPAZINE) 10 MG tablet Take 1 tablet (10 mg total) by mouth 4 (four) times daily as needed for nausea or vomiting. 10/05/13  Yes Farrel Gobble, MD  simvastatin (ZOCOR) 40 MG tablet Take 40 mg by mouth at bedtime.   Yes Historical Provider, MD   Physical Exam: Filed Vitals:   10/25/13 1530  BP: 83/40  Pulse:   Temp:   Resp: 14    BP 83/40  Pulse 88  Temp(Src) 97.7 F (36.5 C) (Oral)  Resp 14  SpO2 95%  General:  Appears calm and comfortable somewhat pale Eyes: PERRL, normal lids, irises & conjunctiva ENT: Ears are clear nose without drainage oropharynx somewhat pink and dry mucous  membranes of his mouth dry Neck: no LAD, masses or thyromegaly Cardiovascular: RRR, no m/r/g. No LE edema. Pedal pulses present and palpable Telemetry: SR, no arrhythmias  Respiratory: Normal effort. Likely shallow. Breath sounds are clear bilaterally to auscultation I hear no wheeze or rhonchi Abdomen: Round soft positive bowel sounds but sluggish. Right upper quadrant with a drain draining moderate amount dark brown liquid. Insertion site with small amount of greenish drainage on dressing. Mild tenderness at the insertion site otherwise abdomen is nontender Skin: no rash or induration seen on limited exam Musculoskeletal: grossly normal tone BUE/BLE Psychiatric: grossly normal mood and affect, speech fluent and appropriate Neurologic: Speech is clear facial symmetry cranial nerves II through XII grossly intact           Labs on Admission:  Basic Metabolic Panel:  Recent Labs Lab 10/22/13 1044 10/25/13 1437  NA 129* 131*  K 5.2 4.8  CL 88* 92*  CO2 21 20  GLUCOSE 125* 105*  BUN 66* 94*  CREATININE 5.15*  5.77*  CALCIUM 10.0 8.1*   Liver Function Tests:  Recent Labs Lab 10/22/13 1044 10/25/13 1437  AST 28 20  ALT 22 16  ALKPHOS 217* 148*  BILITOT 0.9 0.6  PROT 7.7 5.9*  ALBUMIN 3.1* 2.4*    Recent Labs Lab 10/25/13 1437  LIPASE 61*   No results found for this basename: AMMONIA,  in the last 168 hours CBC:  Recent Labs Lab 10/22/13 1044 10/25/13 1437  WBC 19.9* 13.8*  NEUTROABS 16.6* 11.1*  HGB 14.1 11.6*  HCT 40.5 33.4*  MCV 90.2 90.3  PLT 534* 369   Cardiac Enzymes: No results found for this basename: CKTOTAL, CKMB, CKMBINDEX, TROPONINI,  in the last 168 hours  BNP (last 3 results) No results found for this basename: PROBNP,  in the last 8760 hours CBG: No results found for this basename: GLUCAP,  in the last 168 hours  Radiological Exams on Admission: Dg Chest 2 View  10/25/2013   CLINICAL DATA:  Hypotension  EXAM: CHEST  2 VIEW  COMPARISON:  None.  FINDINGS: Lungs are essentially clear. No focal consolidation. No pleural effusion or pneumothorax.  The heart is normal in size.  Right chest power port terminating in the mid SVC.  Suspected external biliary drain overlying the right upper abdomen, incompletely visualized.  IMPRESSION: No evidence of acute cardiopulmonary disease.   Electronically Signed   By: Julian Hy M.D.   On: 10/25/2013 14:54    EKG:   Assessment/Plan Principal Problem:   Sepsis: Poorly likely related to urinary tract infection in the setting of volume depletion. Will admit to step down. Will vigorous IV fluids. Blood cultures pending. Lactic acid pending. Will continue the rocephin. Will hold his lisinopril. Currently he is afebrile and nontoxic appearing but also appears ill. Will monitor closely.  Active Problems: Acute on chronic renal failure: Likely related to decreased by mouth intake in the setting of chemotherapy and nausea as well as a component of ATN secondary to hypotension. Will hold any nephrotoxins, IV fluids as above  monitor intake and output. Of note patient is status post right ureteral drain do to right hydronephrosis. If no improvement tomorrow consider renal consult.  UTI: Await urine cultures. Continue Rocephin that was initiated in the emergency department. See problem #1 and associated therapies.   Hyponatremia: Related to volume depletion secondary to decreased by mouth intake. Will provide IV fluids and recheck in the a.m.     Oral candidiasis: Related to  chemotherapy. Will continue home medication    Hypertension: See #1. Patient is on lisinopril at home. Will hold this for now.    Biliary obstruction due to malignant neoplasm: Status post percutaneous biliary drainage. Appears stable at baseline    Hydronephrosis of right kidney: Status post right ureteral stent. Monitor intake and output. See #2    Duodenal cancer: As had one cycle of chemotherapy. Scheduled for second cycle this week. Will request oncology consult.      Volume depletion: Related to chemotherapy. See #1.     Hypercholesterolemia: Continue home medication    Panic attacks: Appears stable at baseline. Continue home      Oncology in the a.m.  Code Status: full Family Communication: wife at bedside Disposition Plan: home when ready  Time spent: 35 minutes  [facility]

## 2013-10-25 NOTE — ED Provider Notes (Signed)
CSN: [id number]     Arrival date & time 10/25/13  1314 History  This chart was scribed for Virgel Manifold, MD by Zettie Pho, ED Scribe. This patient was seen in room APA09/AP[facility]09 and the patient's care was started at 1:45 PM.    Chief Complaint  Patient presents with  . Hypotension   The history is provided by the patient. No language interpreter was used.   HPI Comments: Trevor Crane is a 73 y.o. male who presents to the Emergency Department brought in by EMS complaining of diffuse weakness with associated fatigue, nausea, emesis, and decreased appetite onset about a week ago after patient started chemotherapy for duodenal cancer. He states he was unable to make his chemotherapy treatment today secondary to his symptoms. Patient was hypotensive at 82/44 en route to the ED and 76/40 at triage. He denies fever, chills, diarrhea, cough, shortness of breath. Has biliary drain and pt reports not acute change in outpt. Making urine, but decreased frequency. Patient also has a history of HTN and hypercholesterolemia.   Past Medical History  Diagnosis Date  . Hypertension   . Hypercholesterolemia   . Panic attacks   . 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 Duodenal cancer    Past Surgical History  Procedure Laterality Date  . Esophagogastroduodenoscopy (egd) with propofol N/A 09/17/2013    Procedure: ESOPHAGOGASTRODUODENOSCOPY (EGD) WITH PROPOFOL;  Surgeon: Rogene Houston, MD;  Location: [facility];  Service: Gastroenterology;  Laterality: N/A;  . Ercp N/A 09/17/2013    Procedure: ATTEMPTED ENDOSCOPIC RETROGRADE CHOLANGIOPANCREATOGRAPHY (ERCP);  Surgeon: Rogene Houston, MD;  Location: [facility];  Service: Gastroenterology;  Laterality: N/A;  . Esophageal biopsy N/A 09/17/2013    Procedure: BIOPSY;  Surgeon: Rogene Houston, MD;  Location: [facility];  Service: Gastroenterology;  Laterality: N/A;   Family History  Problem Relation Age of Onset  . Cancer Mother     colon cancer  . Hypertension  Father   . Hypertension Maternal Uncle    History  Substance Use Topics  . Smoking status: Never Smoker   . Smokeless tobacco: Former Systems developer  . Alcohol Use: 0.6 oz/week    1 Cans of beer per week     Comment: reports drank moderately until Oct 15th, none since then.    Review of Systems  Constitutional: Positive for appetite change and fatigue. Negative for fever and chills.  Respiratory: Negative for cough and shortness of breath.   Gastrointestinal: Positive for nausea and vomiting. Negative for diarrhea.  Neurological: Positive for weakness.  All other systems reviewed and are negative.    Allergies  Sulfa antibiotics  Home Medications   Current Outpatient Rx  Name  Route  Sig  Dispense  Refill  . acetaminophen (TYLENOL) 325 MG tablet   Oral   Take 325 mg by mouth every 6 (six) hours as needed for mild pain or moderate pain.         . calcium carbonate (TUMS - DOSED IN MG ELEMENTAL CALCIUM) 500 MG chewable tablet   Oral   Chew 1 tablet by mouth 3 (three) times daily as needed for indigestion or heartburn.          . capecitabine (XELODA) 500 MG tablet   Oral   Take 1,000 mg/m2 by mouth daily. 1000mg  twice a day with food for 7 days on, 7 days off for 6 cycles         . dexamethasone (DECADRON) 4 MG tablet  Starting the day after chemo, take 2 tablets in the am and 2 tablets in the pm for 2 days. Then Stop. Take with food.   32 tablet   0   . Diphenhyd-Hydrocort-Nystatin (FIRST-DUKES MOUTHWASH) SUSP   Mouth/Throat   Use as directed 5 mLs in the mouth or throat 4 (four) times daily as needed.   400 mL   1   . diphenoxylate-atropine (LOMOTIL) 2.5-0.025 MG per tablet   Oral   Take 1 tablet by mouth 4 (four) times daily as needed for diarrhea or loose stools.   30 tablet   1   . FLUoxetine (PROZAC) 20 MG capsule   Oral   Take 20 mg by mouth every morning.          . fluticasone (FLONASE) 50 MCG/ACT nasal spray   Each Nare   Place 1 spray into  both nostrils daily as needed for allergies or rhinitis.         Marland Kitchen lidocaine-prilocaine (EMLA) cream      Apply a quarter size amount to port site 1 hour prior to chemo. Do not rub in. Cover with plastic wrap.   30 g   3   . lisinopril (PRINIVIL,ZESTRIL) 5 MG tablet   Oral   Take 5 mg by mouth every morning.          . Loratadine-Pseudoephedrine (CLARITIN-D 24 HOUR PO)   Oral   Take 0.5 tablets by mouth as needed.          Marland Kitchen LORazepam (ATIVAN) 1 MG tablet   Oral   Take 1 mg by mouth 3 (three) times daily as needed for anxiety.         . methylcellulose (ARTIFICIAL TEARS) 1 % ophthalmic solution   Both Eyes   Place 1 drop into both eyes 2 (two) times daily as needed (dry eyes).         . metoCLOPramide (REGLAN) 5 MG tablet      Starting the day after chemo, take 1 tablet four times a day x 48 hours. Then may take 1 tablet four times a day IF needed for nausea/vomiting.   60 tablet   3   . omeprazole (PRILOSEC) 20 MG capsule   Oral   Take 20 mg by mouth daily.         . ondansetron (ZOFRAN) 4 MG tablet   Oral   Take 4 mg by mouth every 8 (eight) hours as needed for nausea or vomiting.         Marland Kitchen oxyCODONE (OXY IR/ROXICODONE) 5 MG immediate release tablet   Oral   Take 1-2 tablets (5-10 mg total) by mouth every 4 (four) hours as needed for severe pain.   100 tablet   0   . pantoprazole (PROTONIX) 40 MG tablet   Oral   Take 40 mg by mouth daily.         . prochlorperazine (COMPAZINE) 10 MG tablet   Oral   Take 1 tablet (10 mg total) by mouth 4 (four) times daily as needed for nausea or vomiting.   60 tablet   3   . simvastatin (ZOCOR) 40 MG tablet   Oral   Take 40 mg by mouth at bedtime.          Triage Vitals: BP 76/40  Pulse 89  Temp(Src) 97.7 F (36.5 C) (Oral)  Resp 16  SpO2 97%  Physical Exam  Nursing note and vitals reviewed. Constitutional: He is oriented to  person, place, and time. He appears well-developed and well-nourished.  No distress.  Tired-appearing.   HENT:  Head: Normocephalic and atraumatic.  Eyes: Conjunctivae are normal.  Neck: Normal range of motion. Neck supple.  Cardiovascular: Normal rate, regular rhythm and normal heart sounds.   Pulmonary/Chest: Effort normal and breath sounds normal. No respiratory distress.  Right-sided port.   Abdominal: He exhibits no distension. There is tenderness. There is no rebound and no guarding.  RUQ drain with bilious appearing material. RUQ and epigastric tenderness.   Musculoskeletal: Normal range of motion.  Neurological: He is alert and oriented to person, place, and time.  Skin: Skin is warm and dry.  Psychiatric: He has a normal mood and affect. His behavior is normal.    ED Course  Procedures (including critical care time)  CRITICAL CARE Performed by: Virgel Manifold  Total critical care time: 35 minutes  Critical care time was exclusive of separately billable procedures and treating other patients. Critical care was necessary to treat or prevent imminent or life-threatening deterioration. Critical care was time spent personally by me on the following activities: development of treatment plan with patient and/or surrogate as well as nursing, discussions with consultants, evaluation of patient's response to treatment, examination of patient, obtaining history from patient or surrogate, ordering and performing treatments and interventions, ordering and review of laboratory studies, ordering and review of radiographic studies, pulse oximetry and re-evaluation of patient's condition.   DIAGNOSTIC STUDIES: Oxygen Saturation is 97% on room air, normal by my interpretation.    COORDINATION OF CARE: 1:54 PM- Will order a chest x-ray, blood labs, and UA. Will order IV fluids to manage symptoms. Discussed that the patient may need to be admitted. Discussed treatment plan with patient at bedside and patient verbalized agreement.   3:27 PM- Discussed that labs  indicate the patient is dehydrated and is probably to ill to continue with the chemotherapy at this time. Discussed that the patient will need to be admitted to the hospital for further evaluation. Discussed treatment plan with patient at bedside and patient verbalized agreement.   Labs Review Labs Reviewed  CBC WITH DIFFERENTIAL - Abnormal; Notable for the following:    WBC 13.8 (*)    RBC 3.70 (*)    Hemoglobin 11.6 (*)    HCT 33.4 (*)    RDW 16.1 (*)    Neutrophils Relative % 81 (*)    Neutro Abs 11.1 (*)    Lymphocytes Relative 11 (*)    All other components within normal limits  URINALYSIS, ROUTINE W REFLEX MICROSCOPIC - Abnormal; Notable for the following:    Color, Urine AMBER (*)    APPearance CLOUDY (*)    Specific Gravity, Urine >1.030 (*)    Hgb urine dipstick LARGE (*)    Bilirubin Urine MODERATE (*)    Ketones, ur TRACE (*)    Protein, ur 100 (*)    Leukocytes, UA MODERATE (*)    All other components within normal limits  COMPREHENSIVE METABOLIC PANEL - Abnormal; Notable for the following:    Sodium 131 (*)    Chloride 92 (*)    Glucose, Bld 105 (*)    BUN 94 (*)    Creatinine, Ser 5.77 (*)    Calcium 8.1 (*)    Total Protein 5.9 (*)    Albumin 2.4 (*)    Alkaline Phosphatase 148 (*)    GFR calc non Af Amer 9 (*)    GFR calc Af Amer 10 (*)  All other components within normal limits  LIPASE, BLOOD - Abnormal; Notable for the following:    Lipase 61 (*)    All other components within normal limits  URINE MICROSCOPIC-ADD ON - Abnormal; Notable for the following:    Squamous Epithelial / LPF MANY (*)    Bacteria, UA MANY (*)    All other components within normal limits  CULTURE, BLOOD (ROUTINE X 2)  CULTURE, BLOOD (ROUTINE X 2)  URINE CULTURE  MRSA PCR SCREENING  LACTIC ACID, PLASMA  COMPREHENSIVE METABOLIC PANEL  CBC   Imaging Review Dg Chest 2 View  10/25/2013   CLINICAL DATA:  Hypotension  EXAM: CHEST  2 VIEW  COMPARISON:  None.  FINDINGS: Lungs are  essentially clear. No focal consolidation. No pleural effusion or pneumothorax.  The heart is normal in size.  Right chest power port terminating in the mid SVC.  Suspected external biliary drain overlying the right upper abdomen, incompletely visualized.  IMPRESSION: No evidence of acute cardiopulmonary disease.   Electronically Signed   By: Julian Hy M.D.   On: 10/25/2013 14:54    EKG Interpretation   None       MDM   1. Hypotension   2. Dehydration, severe   3. Hypertension   4. Hypercholesterolemia   5. Panic attacks      72yM with hypotension/generalized weakness. ARF. Recent imaging with R hydronephrosis from likely extrinisic ureteral compression. Suspect large component of dehydration. Labs consistent with this. Pt with anorexia and very poor PO intake since starting chemo last week.  Pt is making urine. UA concerning for possible UTI, although many squamous cells in sample. No specific urinary complaints, but with hypotension will tx for possible UTI. Urine and blood cultures sent. Clinically I do not think he is septic. Hypotension persistent despite several liters of IVF. HR is improving though. Will admit to step down.   I personally preformed the services scribed in my presence. The recorded information has been reviewed is accurate. Virgel Manifold, MD.     Virgel Manifold, MD 10/25/13 [phone]

## 2013-10-25 NOTE — Progress Notes (Signed)
ANTIBIOTIC CONSULT NOTE - INITIAL  Pharmacy Consult for Rocephin Indication: UTI  Allergies  Allergen Reactions  . Sulfa Antibiotics Itching and Rash    Patient Measurements:   Last recorded weight = 78Kg  Vital Signs: Temp: 97.7 F (36.5 C) (01/26 1321) Temp src: Oral (01/26 1321) BP: 81/36 mmHg (01/26 1634) Pulse Rate: 87 (01/26 1635) Intake/Output from previous day:   Intake/Output from this shift: Total I/O In: 1000 [I.V.:1000] Out: -   Labs:  Recent Labs  10/25/13 1437  WBC 13.8*  HGB 11.6*  PLT 369  CREATININE 5.77*   The CrCl is unknown because both a height and weight (above a minimum accepted value) are required for this calculation. No results found for this basename: VANCOTROUGH, VANCOPEAK, VANCORANDOM, GENTTROUGH, GENTPEAK, GENTRANDOM, TOBRATROUGH, TOBRAPEAK, TOBRARND, AMIKACINPEAK, AMIKACINTROU, AMIKACIN,  in the last 72 hours   Microbiology: No results found for this or any previous visit (from the past 720 hour(s)).  Medical History: Past Medical History  Diagnosis Date  . Hypertension   . Hypercholesterolemia   . Panic attacks   . 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 Duodenal cancer   . Oral candidiasis   . Hiccups     Medications:  Scheduled:  . enoxaparin (LOVENOX) injection  40 mg Subcutaneous Q24H  . [START ON 10/26/2013] FLUoxetine  20 mg Oral q morning - 10a  . loratadine  10 mg Oral Daily  . pantoprazole  40 mg Oral Daily  . simvastatin  40 mg Oral QHS   Assessment: 73yo male c/o fever, chills, SOB, and diarrhea.  Starting Rocephin for suspected UTI.  Goal of Therapy:  Eradicate infection.  Plan:  Rocephin 1gm IV q24hrs Monitor labs and cultures  Hart Robinsons A 10/25/2013,5:11 PM

## 2013-10-25 NOTE — ED Notes (Signed)
EDP aware pt has received two liter bolus. EDP reported to bolus pt a third bag then once that finished run a 4th liter of NS @ 289ml/hr.

## 2013-10-26 ENCOUNTER — Inpatient Hospital Stay (HOSPITAL_COMMUNITY): Payer: Medicare Other

## 2013-10-26 ENCOUNTER — Other Ambulatory Visit (HOSPITAL_COMMUNITY): Payer: Self-pay | Admitting: Oncology

## 2013-10-26 DIAGNOSIS — I959 Hypotension, unspecified: Secondary | ICD-10-CM

## 2013-10-26 DIAGNOSIS — E872 Acidosis, unspecified: Secondary | ICD-10-CM | POA: Diagnosis not present

## 2013-10-26 DIAGNOSIS — E875 Hyperkalemia: Secondary | ICD-10-CM | POA: Diagnosis not present

## 2013-10-26 DIAGNOSIS — C170 Malignant neoplasm of duodenum: Secondary | ICD-10-CM

## 2013-10-26 LAB — COMPREHENSIVE METABOLIC PANEL
ALT: 13 U/L (ref 0–53)
AST: 32 U/L (ref 0–37)
Albumin: 2 g/dL — ABNORMAL LOW (ref 3.5–5.2)
Alkaline Phosphatase: 126 U/L — ABNORMAL HIGH (ref 39–117)
BUN: 80 mg/dL — ABNORMAL HIGH (ref 6–23)
CALCIUM: 8 mg/dL — AB (ref 8.4–10.5)
CO2: 16 meq/L — AB (ref 19–32)
CREATININE: 4.02 mg/dL — AB (ref 0.50–1.35)
Chloride: 102 mEq/L (ref 96–112)
GFR calc non Af Amer: 14 mL/min — ABNORMAL LOW (ref >90)
GFR, EST AFRICAN AMERICAN: 16 mL/min — AB (ref >90)
Glucose, Bld: 98 mg/dL (ref 70–99)
Potassium: 6.4 mEq/L — ABNORMAL HIGH (ref 3.7–5.3)
Sodium: 134 mEq/L — ABNORMAL LOW (ref 137–147)
TOTAL PROTEIN: 5.2 g/dL — AB (ref 6.0–8.3)
Total Bilirubin: 0.5 mg/dL (ref 0.3–1.2)

## 2013-10-26 LAB — BASIC METABOLIC PANEL
BUN: 71 mg/dL — ABNORMAL HIGH (ref 6–23)
CALCIUM: 8 mg/dL — AB (ref 8.4–10.5)
CO2: 19 meq/L (ref 19–32)
CREATININE: 3.36 mg/dL — AB (ref 0.50–1.35)
Chloride: 103 mEq/L (ref 96–112)
GFR calc Af Amer: 20 mL/min — ABNORMAL LOW (ref >90)
GFR calc non Af Amer: 17 mL/min — ABNORMAL LOW (ref >90)
GLUCOSE: 101 mg/dL — AB (ref 70–99)
Potassium: 4.8 mEq/L (ref 3.7–5.3)
Sodium: 137 mEq/L (ref 137–147)

## 2013-10-26 LAB — CORTISOL: CORTISOL PLASMA: 17.9 ug/dL

## 2013-10-26 LAB — CBC
HEMATOCRIT: 31.1 % — AB (ref 39.0–52.0)
HEMOGLOBIN: 10.6 g/dL — AB (ref 13.0–17.0)
MCH: 31.1 pg (ref 26.0–34.0)
MCHC: 34.1 g/dL (ref 30.0–36.0)
MCV: 91.2 fL (ref 78.0–100.0)
Platelets: 317 10*3/uL (ref 150–400)
RBC: 3.41 MIL/uL — AB (ref 4.22–5.81)
RDW: 16.2 % — ABNORMAL HIGH (ref 11.5–15.5)
WBC: 7.9 10*3/uL (ref 4.0–10.5)

## 2013-10-26 LAB — URINE CULTURE
Colony Count: NO GROWTH
Culture: NO GROWTH

## 2013-10-26 MED ORDER — SODIUM BICARBONATE 8.4 % IV SOLN
INTRAVENOUS | Status: DC
  Administered 2013-10-26 (×2): via INTRAVENOUS
  Filled 2013-10-26 (×7): qty 850

## 2013-10-26 MED ORDER — SODIUM POLYSTYRENE SULFONATE 15 GM/60ML PO SUSP
15.0000 g | Freq: Once | ORAL | Status: AC
  Administered 2013-10-26: 15 g via ORAL
  Filled 2013-10-26: qty 60

## 2013-10-26 MED ORDER — LORAZEPAM 0.5 MG PO TABS
0.5000 mg | ORAL_TABLET | Freq: Four times a day (QID) | ORAL | Status: DC | PRN
  Administered 2013-10-26 – 2013-10-27 (×2): 0.5 mg via ORAL
  Filled 2013-10-26 (×2): qty 1

## 2013-10-26 MED ORDER — ENOXAPARIN SODIUM 30 MG/0.3ML ~~LOC~~ SOLN
30.0000 mg | SUBCUTANEOUS | Status: DC
  Administered 2013-10-26: 30 mg via SUBCUTANEOUS
  Filled 2013-10-26: qty 0.3

## 2013-10-26 NOTE — Progress Notes (Signed)
Patient seen, independently examined and chart reviewed. I agree with exam, assessment and plan discussed with Dyanne Carrel, NP.  He has remained hypotensive overnight but has no other complaints. In fact he feels quite a bit better today and his wife agrees. He has no localizing symptoms.  Although he has remained hypotensive his heart rate is normal and there is no hypoxia. Currently sitting on the side of the bed eating breakfast and appears dramatically improved. Lungs are clear to auscultation bilaterally. No wheezes, rales or rhonchi and heart sounds are normal without murmur, rub or gallop. Mentation seems improved as well.pupils equal round react to light, normal lids, irises, conjunctiva. Neck is unremarkable. Skin is unremarkable. Musculoskeletal and neurologic appear grossly normal.  He has had over 4 L of urine output and BUN and creatinine have improved significantly. Moderate hyperkalemia is noted but EKG shows sinus rhythm with no acute changes.   Assessment/plan Improving dramatically, although he remains hypotensive he is nontoxic. Random cortisol was normal.will change to sodium bicarbonate IV fluids for metabolic acidosis secondary to renal failure, check a renal ultrasound to followup on hydronephrosis and continue empiric antibiotic therapy. Should be able to transfer out of the unit in the next 24 hours and discharge in the next 48. Above discussed with wife.  Murray Hodgkins, MD [facility] [phone]

## 2013-10-26 NOTE — Consult Note (Signed)
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  He was seen as a work-in at the [facility] on 10/22/2013 with failure to thrive-like complaints.  He was noted to be dehydrated with oral candidiasis.  He denies any infection signs or symptoms at that time and clinically, he looks stable yet frail.  He was to report to the [facility] on 10/25/2013 for re-evaluation and cycle 2 of chemotherapy (if better), but his wife called the clinic reporting that he did not eat or drink over the weekend and he felt very weak.  We recommended an ED visit to allow for quicker triage and intervention/treatment.   He presented to the [facility] ED on 1/26.  He was noted to be hypotensive and he was resuscitated with 2 L of NS.  Work-up in the ED revealed moderate leukocytes in urine.  Blood cultures were drawn.  Lactic acid was WNL at 1.2   PAST MEDICAL HISTORY:   Past Medical History  Diagnosis Date  . Hypertension   . Hypercholesterolemia   . Panic attacks   . 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 Duodenal cancer   . Oral candidiasis   . Hiccups     ALLERGIES: Allergies  Allergen Reactions  . Sulfa Antibiotics Itching and Rash      MEDICATIONS: I have reviewed the patient's current medications.     PAST SURGICAL HISTORY Past Surgical History  Procedure Laterality Date  . Esophagogastroduodenoscopy  (egd) with propofol N/A 09/17/2013    Procedure: ESOPHAGOGASTRODUODENOSCOPY (EGD) WITH PROPOFOL;  Surgeon: Rogene Houston, MD;  Location: [facility];  Service: Gastroenterology;  Laterality: N/A;  . Ercp N/A 09/17/2013    Procedure: ATTEMPTED ENDOSCOPIC RETROGRADE CHOLANGIOPANCREATOGRAPHY (ERCP);  Surgeon: Rogene Houston, MD;  Location: [facility];  Service: Gastroenterology;  Laterality: N/A;  . Esophageal biopsy N/A 09/17/2013    Procedure: BIOPSY;  Surgeon: Rogene Houston, MD;  Location: [facility];  Service: Gastroenterology;  Laterality: N/A;    FAMILY HISTORY: Family History  Problem Relation Age of Onset  . Cancer Mother     colon cancer  . Hypertension Father   . Hypertension Maternal Uncle     SOCIAL HISTORY:  reports that he has never smoked. He has quit using smokeless tobacco. He reports that he drinks about 0.6 ounces of alcohol per week. He reports that he does not use illicit drugs.  PERFORMANCE STATUS: The patient's performance status is 2 - Symptomatic, <50% confined to bed  PHYSICAL EXAM: Most Recent Vital Signs: Blood pressure 88/39, pulse 94, temperature 97.9 F (36.6 C), temperature source Axillary, resp. rate 11, height _0  (1.803 m), weight 176 lb 5.9 oz (80 kg), SpO2 100.00%. General appearance: alert, cooperative, appears stated age and no distress Head: Normocephalic, without obvious abnormality, atraumatic Eyes: negative findings: lids and lashes normal, conjunctivae and sclerae normal and corneas clear  Extremities: extremities normal, atraumatic, no cyanosis or edema Skin: Skin color, texture, turgor normal. No rashes or lesions Neurologic: Grossly normal  LABORATORY DATA:  Results for orders placed during the hospital encounter of 10/25/13 (from the past 48 hour(s))  URINALYSIS, ROUTINE W REFLEX MICROSCOPIC     Status: Abnormal   Collection Time    10/25/13  2:20 PM      Result Value Range   Color, Urine AMBER (*) YELLOW   Comment: BIOCHEMICALS MAY BE  AFFECTED BY COLOR   APPearance CLOUDY (*) CLEAR   Specific Gravity, Urine >1.030 (*) 1.005 - 1.030   pH 5.0  5.0 - 8.0   Glucose, UA NEGATIVE  NEGATIVE mg/dL   Hgb urine dipstick LARGE (*) NEGATIVE   Bilirubin Urine MODERATE (*) NEGATIVE   Ketones, ur TRACE (*) NEGATIVE mg/dL   Protein, ur 100 (*) NEGATIVE mg/dL   Urobilinogen, UA 0.2  0.0 - 1.0 mg/dL   Nitrite NEGATIVE  NEGATIVE   Leukocytes, UA MODERATE (*) NEGATIVE  URINE MICROSCOPIC-ADD ON     Status: Abnormal   Collection Time    10/25/13  2:20 PM      Result Value Range   Squamous Epithelial / LPF MANY (*) RARE   WBC, UA 21-50  <3 WBC/hpf   RBC / HPF 21-50  <3 RBC/hpf   Bacteria, UA MANY (*) RARE  CULTURE, BLOOD (ROUTINE X 2)     Status: None   Collection Time    10/25/13  2:37 PM      Result Value Range   Specimen Description BLOOD RIGHT ANTECUBITAL     Special Requests       Value: BOTTLES DRAWN AEROBIC AND ANAEROBIC AEB=8CC ANA=6CC IMMUNE:COMPROMISED   Culture NO GROWTH 1 DAY     Report Status PENDING    CULTURE, BLOOD (ROUTINE X 2)     Status: None   Collection Time    10/25/13  2:37 PM      Result Value Range   Specimen Description BLOOD LEFT ANTECUBITAL     Special Requests       Value: BOTTLES DRAWN AEROBIC AND ANAEROBIC AEB=10CC ANA=8CC IMMUNE:COMPROMISED   Culture NO GROWTH 1 DAY     Report Status PENDING    CBC WITH DIFFERENTIAL     Status: Abnormal   Collection Time    10/25/13  2:37 PM      Result Value Range   WBC 13.8 (*) 4.0 - 10.5 K/uL   RBC 3.70 (*) 4.22 - 5.81 MIL/uL   Hemoglobin 11.6 (*) 13.0 - 17.0 g/dL   HCT 33.4 (*) 39.0 - 52.0 %   MCV 90.3  78.0 - 100.0 fL   MCH 31.4  26.0 - 34.0 pg   MCHC 34.7  30.0 - 36.0 g/dL   RDW 16.1 (*) 11.5 - 15.5 %   Platelets 369  150 - 400 K/uL   Neutrophils Relative % 81 (*) 43 - 77 %   Neutro Abs 11.1 (*) 1.7 - 7.7 K/uL   Lymphocytes Relative 11 (*) 12 - 46 %   Lymphs Abs 1.5  0.7 - 4.0 K/uL   Monocytes Relative 7  3 - 12 %   Monocytes Absolute 0.9  0.1  - 1.0 K/uL   Eosinophils Relative 2  0 - 5 %   Eosinophils Absolute 0.2  0.0 - 0.7 K/uL   Basophils Relative 0  0 - 1 %   Basophils Absolute 0.0  0.0 - 0.1 K/uL  COMPREHENSIVE  METABOLIC PANEL     Status: Abnormal   Collection Time    10/25/13  2:37 PM      Result Value Range   Sodium 131 (*) 137 - 147 mEq/L   Potassium 4.8  3.7 - 5.3 mEq/L   Chloride 92 (*) 96 - 112 mEq/L   CO2 20  19 - 32 mEq/L   Glucose, Bld 105 (*) 70 - 99 mg/dL   BUN 94 (*) 6 - 23 mg/dL   Creatinine, Ser 5.77 (*) 0.50 - 1.35 mg/dL   Calcium 8.1 (*) 8.4 - 10.5 mg/dL   Total Protein 5.9 (*) 6.0 - 8.3 g/dL   Albumin 2.4 (*) 3.5 - 5.2 g/dL   AST 20  0 - 37 U/L   ALT 16  0 - 53 U/L   Alkaline Phosphatase 148 (*) 39 - 117 U/L   Total Bilirubin 0.6  0.3 - 1.2 mg/dL   GFR calc non Af Amer 9 (*) >90 mL/min   GFR calc Af Amer 10 (*) >90 mL/min   Comment: (NOTE)     The eGFR has been calculated using the CKD EPI equation.     This calculation has not been validated in all clinical situations.     eGFR's persistently <90 mL/min signify possible Chronic Kidney     Disease.  LIPASE, BLOOD     Status: Abnormal   Collection Time    10/25/13  2:37 PM      Result Value Range   Lipase 61 (*) 11 - 59 U/L  CORTISOL     Status: None   Collection Time    10/25/13  2:37 PM      Result Value Range   Cortisol, Plasma 17.9     Comment: (NOTE)     AM:  4.3 - 22.4 ug/dL     PM:  3.1 - 16.7 ug/dL     Performed at [facility] ACID, PLASMA     Status: None   Collection Time    10/25/13  4:36 PM      Result Value Range   Lactic Acid, Venous 1.2  0.5 - 2.2 mmol/L  MRSA PCR SCREENING     Status: None   Collection Time    10/25/13  6:30 PM      Result Value Range   MRSA by PCR NEGATIVE  NEGATIVE   Comment:            The GeneXpert MRSA Assay (FDA     approved for NASAL specimens     only), is one component of a     comprehensive MRSA colonization     surveillance program. It is not     intended to  diagnose MRSA     infection nor to guide or     monitor treatment for     MRSA infections.  COMPREHENSIVE METABOLIC PANEL     Status: Abnormal   Collection Time    10/26/13  4:37 AM      Result Value Range   Sodium 134 (*) 137 - 147 mEq/L   Potassium 6.4 (*) 3.7 - 5.3 mEq/L   Comment: DELTA CHECK NOTED   Chloride 102  96 - 112 mEq/L   CO2 16 (*) 19 - 32 mEq/L   Glucose, Bld 98  70 - 99 mg/dL   BUN 80 (*) 6 - 23 mg/dL   Creatinine, Ser 4.02 (*) 0.50 - 1.35 mg/dL   Calcium 8.0 (*) 8.4 -  10.5 mg/dL   Total Protein 5.2 (*) 6.0 - 8.3 g/dL   Albumin 2.0 (*) 3.5 - 5.2 g/dL   AST 32  0 - 37 U/L   ALT 13  0 - 53 U/L   Alkaline Phosphatase 126 (*) 39 - 117 U/L   Total Bilirubin 0.5  0.3 - 1.2 mg/dL   GFR calc non Af Amer 14 (*) >90 mL/min   GFR calc Af Amer 16 (*) >90 mL/min   Comment: (NOTE)     The eGFR has been calculated using the CKD EPI equation.     This calculation has not been validated in all clinical situations.     eGFR's persistently <90 mL/min signify possible Chronic Kidney     Disease.  CBC     Status: Abnormal   Collection Time    10/26/13  4:37 AM      Result Value Range   WBC 7.9  4.0 - 10.5 K/uL   RBC 3.41 (*) 4.22 - 5.81 MIL/uL   Hemoglobin 10.6 (*) 13.0 - 17.0 g/dL   HCT 31.1 (*) 39.0 - 52.0 %   MCV 91.2  78.0 - 100.0 fL   MCH 31.1  26.0 - 34.0 pg   MCHC 34.1  30.0 - 36.0 g/dL   RDW 16.2 (*) 11.5 - 15.5 %   Platelets 317  150 - 400 K/uL      RADIOGRAPHY: Dg Chest 2 View  10/25/2013   CLINICAL DATA:  Hypotension  EXAM: CHEST  2 VIEW  COMPARISON:  None.  FINDINGS: Lungs are essentially clear. No focal consolidation. No pleural effusion or pneumothorax.  The heart is normal in size.  Right chest power port terminating in the mid SVC.  Suspected external biliary drain overlying the right upper abdomen, incompletely visualized.  IMPRESSION: No evidence of acute cardiopulmonary disease.   Electronically Signed   By: Julian Hy M.D.   On: 10/25/2013 14:54        PATHOLOGY:    09/17/2013  Diagnosis Ampulla of Vater, biopsy, polyp - INVASIVE ADENOCARCINOMA. Microscopic Comment The duodenal biopsy fragments are involved by invasive adenocarcinoma. . Dr. Tresa Moore agrees. Called to Dr. Laural Golden on 09/20/13. (JDP:caf 09/20/13) Claudette Laws MD Pathologist, Electronic Signature (Case signed 09/20/2013)    ASSESSMENT:  1. Acute on chronic renal failure 2. Volume depletion, improved 3. UTI with possible sepsis 4. 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 5. Hypotension, stable 6. DNR   PLAN:  1. I personally reviewed and went over laboratory results with the patient. 2. I personally reviewed and went over radiographic studies with the patient. 3. Chart is reviewed 4. Chemotherapy on hold at this time while in the hospital. 5. Will follow along as an inpatient. 6. Recommend D/C of any outpatient antihypertensives 7. Return to the [facility] on 11/01/2013 for restart of chemotherapy.  Bring Xeloda tablets with him to the clinic appointment.  All questions were answered. The patient knows to call the clinic with any problems, questions or concerns. We can certainly see the patient much sooner if necessary.  Patient and plan discussed with Dr. Farrel Gobble and he is in agreement with the aforementioned.    Krishon Adkison

## 2013-10-26 NOTE — Progress Notes (Signed)
[facility] PROGRESS NOTE  Trevor Crane XLK:[id number] DOB: [birth year] DOA: 10/25/2013 PCP: No PCP Per Patient  Assessment/Plan: Sepsis: likely related to urinary tract infection in the setting of severe volume depletion. Improved this am. BP remains somewhat soft. White count, lactic acid and cortisol levels all within normal limits.  Blood cultures pending. He is afebrile and non-toxic appearing.  Will continue the rocephin day #2. Continue to hold lisinopril. Will continue IV fluids but change to sodium bicarbonate in sterile water due to metabolic acidosis. Will monitor closely  Active Problems:  Acute on chronic renal failure: Likely related to decreased by mouth intake in the setting of chemotherapy and nausea as well as a component of ATN secondary to hypotension. Improving today. Urine output good.  Continue to  hold any nephrotoxins, IV fluids as above monitor intake and output. Of note patient is status post right ureteral drain do to right hydronephrosis. Will check renal US for completeness.   Metabolic acidosis: likely related to renal failure. Lactic acid within normal limits. Will change IV fluids as above. Will recheck BMET this afternoon.  Hyperkalemia: related to #2. kayexalate given. EKG pending. Will recheck this afternoon. Follow EKG   UTI: Await urine cultures. Continue Rocephin day #2.  See problem #1 and associated therapies.   Hyponatremia: Related to volume depletion secondary to decreased by mouth intake. Improving today. Will provide IV fluids as indicated above. Recheck in the a.m.   Oral candidiasis: Related to chemotherapy. Will continue home medication  Hypertension: See #1. Patient is on lisinopril at home. Will hold this for now.  Biliary obstruction due to malignant neoplasm: Status post percutaneous biliary drainage. Appears stable at baseline   Hydronephrosis of right kidney: Status post right ureteral stent. Will obtain renal US for completeness.   Monitor intake and output. See #2   Duodenal cancer: Has had one cycle of chemotherapy. Scheduled for second cycle this week. Will request oncology consult.   Volume depletion: Related to chemotherapy. See #1.  Hypercholesterolemia: Continue home medication  Panic attacks: Appears stable at baseline. Continue home     Code Status: DNR Family Communication: wife at bedside Disposition Plan: home when ready hopefully 2 days or so   Consultants:  oncology  Procedures:  none  Antibiotics:  levaquin 10/25/13>>>  HPI/Subjective: Sitting on side of bed eating breakfast. Denies pain/discomfort.   Objective: Filed Vitals:   10/26/13 0630  BP: 88/39  Pulse:   Temp:   Resp: 11    Intake/Output Summary (Last 24 hours) at 10/26/13 0847 Last data filed at 10/26/13 0500  Gross per 24 hour  Intake   2320 ml  Output   4490 ml  Net  -2170 ml   Filed Weights   10/25/13 1809  Weight: 80 kg (176 lb 5.9 oz)    Exam:   General:  Appears better. Improved color NAD  Cardiovascular: RRR No MGR No LE edema  Respiratory: normal effort BS clear bilaterally no wheeze no rhonchi  Abdomen: soft +BS non-tender to palpation  Musculoskeletal: no clubbing or cyanosis.    Data Reviewed: Basic Metabolic Panel:  Recent Labs Lab 10/22/13 1044 10/25/13 1437 10/26/13 0437  NA 129* 131* 134*  K 5.2 4.8 6.4*  CL 88* 92* 102  CO2 21 20 16*  GLUCOSE 125* 105* 98  BUN 66* 94* 80*  CREATININE 5.15* 5.77* 4.02*  CALCIUM 10.0 8.1* 8.0*   Liver Function Tests:  Recent Labs Lab 10/22/13 1044 10/25/13 1437 10/26/13 0437  AST 28  20 32  ALT 22 16 13   ALKPHOS 217* 148* 126*  BILITOT 0.9 0.6 0.5  PROT 7.7 5.9* 5.2*  ALBUMIN 3.1* 2.4* 2.0*    Recent Labs Lab 10/25/13 1437  LIPASE 61*   No results found for this basename: AMMONIA,  in the last 168 hours CBC:  Recent Labs Lab 10/22/13 1044 10/25/13 1437 10/26/13 0437  WBC 19.9* 13.8* 7.9  NEUTROABS 16.6* 11.1*  --    HGB 14.1 11.6* 10.6*  HCT 40.5 33.4* 31.1*  MCV 90.2 90.3 91.2  PLT 534* 369 317   Cardiac Enzymes: No results found for this basename: CKTOTAL, CKMB, CKMBINDEX, TROPONINI,  in the last 168 hours BNP (last 3 results) No results found for this basename: PROBNP,  in the last 8760 hours CBG: No results found for this basename: GLUCAP,  in the last 168 hours  Recent Results (from the past 240 hour(s))  MRSA PCR SCREENING     Status: None   Collection Time    10/25/13  6:30 PM      Result Value Range Status   MRSA by PCR NEGATIVE  NEGATIVE Final   Comment:            The GeneXpert MRSA Assay (FDA     approved for NASAL specimens     only), is one component of a     comprehensive MRSA colonization     surveillance program. It is not     intended to diagnose MRSA     infection nor to guide or     monitor treatment for     MRSA infections.     Studies: Dg Chest 2 View  10/25/2013   CLINICAL DATA:  Hypotension  EXAM: CHEST  2 VIEW  COMPARISON:  None.  FINDINGS: Lungs are essentially clear. No focal consolidation. No pleural effusion or pneumothorax.  The heart is normal in size.  Right chest power port terminating in the mid SVC.  Suspected external biliary drain overlying the right upper abdomen, incompletely visualized.  IMPRESSION: No evidence of acute cardiopulmonary disease.   Electronically Signed   By: Julian Hy M.D.   On: 10/25/2013 14:54    Scheduled Meds: . cefTRIAXone (ROCEPHIN)  IV  1 g Intravenous Q24H  . enoxaparin (LOVENOX) injection  40 mg Subcutaneous Q24H  . FLUoxetine  20 mg Oral q morning - 10a  . loratadine  10 mg Oral Daily  . pantoprazole  40 mg Oral Daily  . simvastatin  40 mg Oral QHS  . sodium polystyrene  15 g Oral Once   Continuous Infusions: .  sodium bicarbonate 150 mEq in sterile water 1000 mL infusion      Principal Problem:   Sepsis Active Problems:   Biliary obstruction due to malignant neoplasm   Hydronephrosis of right kidney    Duodenal cancer   UTI (lower urinary tract infection)   Volume depletion   Acute on chronic renal failure   Renal mass   Hyponatremia   Oral candidiasis   Hypertension   Hypercholesterolemia   Panic attacks   Severe dehydration   Metabolic acidosis   Hyperkalemia    Time spent: 30 minutes    [facility] Pager [phone]. If 7PM-7AM, please contact night-coverage at [email or web address], password [facility] 10/26/2013, 8:47 AM  LOS: 1 day

## 2013-10-26 NOTE — Progress Notes (Signed)
INITIAL NUTRITION ASSESSMENT  DOCUMENTATION CODES Per approved criteria  -Severe malnutrition in the context of chronic illness   Pt meets criteria for severe malnutrition in the context of chronic illness: >5% weight loss (10%) in 1 month and energy intake </= 75% for >/= 1 month  INTERVENTION: Recommend soft diet, non-spicy foods and foods that are non-acidic.   NUTRITION DIAGNOSIS: Malnutrition related to decreased po's, increased needs as evidenced by sepsis, wt loss hx and diet recall.   Goal: Meet nutrition needs as able. Optimize quality of life, honor food preferences and provide pleasure foods.  Monitor:  Po intake, labs and wt trends   Reason for Assessment: Malnutrition Screen Score =  4  73 y.o. male  Admitting Dx: Sepsis  ASSESSMENT: Pt is 73 yo male with stage IV ampulla of vater adenocarcinoma. Palliative chemotherapy. Dehydrated on admission, oral candidiasis. Poor po intake food and fluids. Increased weakness. Severe, unintentional wt loss of 19#, 10% <30 days. Pt declines oral supplements at this time. Will continue to follow his nutrition care and provide support as needed.  Height: Ht Readings from Last 1 Encounters:  10/25/13 5\' 11"  (1.803 m)    Weight: Wt Readings from Last 1 Encounters:  10/25/13 176 lb 5.9 oz (80 kg)    Ideal Body Weight: 172# (78 kg)  % Ideal Body Weight: 99%  Wt Readings from Last 10 Encounters:  10/25/13 176 lb 5.9 oz (80 kg)  10/11/13 173 lb 12.8 oz (78.835 kg)  10/07/13 180 lb (81.647 kg)  10/01/13 197 lb (89.359 kg)  09/28/13 197 lb 3.2 oz (89.449 kg)  09/15/13 195 lb 11.2 oz (88.769 kg)  09/15/13 195 lb 11.2 oz (88.769 kg)  09/15/13 195 lb 11.2 oz (88.769 kg)    Usual Body Weight: 195#  % Usual Body Weight: 90%   BMI:  Body mass index is 24.61 kg/(m^2). normal range  Estimated Nutritional Needs: Kcal: 2000-2240  Protein: 96-112 gr Fluid: 2.0-2.2 liters daily  Skin: intact  Diet Order:  General  EDUCATION NEEDS: -No education needs identified at this time   Intake/Output Summary (Last 24 hours) at 10/26/13 1612 Last data filed at 10/26/13 1338  Gross per 24 hour  Intake   1320 ml  Output   5191 ml  Net  -3871 ml    Last BM: 10/26/13  Labs:   Recent Labs Lab 10/25/13 1437 10/26/13 0437 10/26/13 1343  NA 131* 134* 137  K 4.8 6.4* 4.8  CL 92* 102 103  CO2 20 16* 19  BUN 94* 80* 71*  CREATININE 5.77* 4.02* 3.36*  CALCIUM 8.1* 8.0* 8.0*  GLUCOSE 105* 98 101*    CBG (last 3)  No results found for this basename: GLUCAP,  in the last 72 hours  Scheduled Meds: . cefTRIAXone (ROCEPHIN)  IV  1 g Intravenous Q24H  . enoxaparin (LOVENOX) injection  30 mg Subcutaneous Q24H  . FLUoxetine  20 mg Oral q morning - 10a  . loratadine  10 mg Oral Daily  . pantoprazole  40 mg Oral Daily  . simvastatin  40 mg Oral QHS    Continuous Infusions: .  sodium bicarbonate 150 mEq in sterile water 1000 mL infusion 125 mL/hr at 10/26/13 1013    Past Medical History  Diagnosis Date  . Hypertension   . Hypercholesterolemia   . Panic attacks   . 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 Duodenal cancer   . Oral candidiasis   . Hiccups  Past Surgical History  Procedure Laterality Date  . Esophagogastroduodenoscopy (egd) with propofol N/A 09/17/2013    Procedure: ESOPHAGOGASTRODUODENOSCOPY (EGD) WITH PROPOFOL;  Surgeon: Rogene Houston, MD;  Location: [facility];  Service: Gastroenterology;  Laterality: N/A;  . Ercp N/A 09/17/2013    Procedure: ATTEMPTED ENDOSCOPIC RETROGRADE CHOLANGIOPANCREATOGRAPHY (ERCP);  Surgeon: Rogene Houston, MD;  Location: [facility];  Service: Gastroenterology;  Laterality: N/A;  . Esophageal biopsy N/A 09/17/2013    Procedure: BIOPSY;  Surgeon: Rogene Houston, MD;  Location: [facility];  Service: Gastroenterology;  Laterality: N/A;    Colman Cater MS,RD,CSG,LDN Office: [phone] Pager: [phone]

## 2013-10-26 NOTE — Care Management Note (Addendum)
    Page 1 of 2   10/27/2013     11:15:47 AM   CARE MANAGEMENT NOTE 10/27/2013  Patient:  Trevor Crane, Trevor Crane   Account Number:  [id number]  Date Initiated:  10/26/2013  Documentation initiated by:  Theophilus Kinds  Subjective/Objective Assessment:   Pt admitted from home with sepsis. Pt lives with his wife and will return home at discharge. Pt is active with [facility] RN.Pt is fairly independent with ADL's.     Action/Plan:   Pts wife stated that pt loses his balance at times. May need HH PT at discharge with [facility]. Will continue to follow for discharge planning needs.   Anticipated DC Date:  10/29/2013   Anticipated DC Plan:  [place]  CM consult      [facility] Choice  Resumption Of Svcs/PTA Provider   Choice offered to / List presented to:  C-3 Spouse        HH arranged  HH-1 RN  [place].   Status of service:  Completed, signed off Medicare Important Message given?  NA - LOS <3 / Initial given by admissions (If response is "NO", the following Medicare IM given date fields will be blank) Date Medicare IM given:   Date Additional Medicare IM given:    Discharge Disposition:  [place]  Per UR Regulation:    If discussed at Long Length of Stay Meetings, dates discussed:    Comments:  10/27/13 [place], RN BSN CM Pt discharged home today with resumption of [facility] Rn and PT. Romualdo Bolk of [facility] is aware and will collect the pts information from the chart. [place] services to resume within 48 hours of discharge. No DME needs noted. Pt and pts nurse aware of discharge arrangements.  10/26/13 [place], RN BSN CM

## 2013-10-26 NOTE — Progress Notes (Signed)
UR chart review completed.  

## 2013-10-27 ENCOUNTER — Encounter (HOSPITAL_COMMUNITY): Payer: Self-pay | Admitting: Internal Medicine

## 2013-10-27 DIAGNOSIS — E43 Unspecified severe protein-calorie malnutrition: Secondary | ICD-10-CM

## 2013-10-27 HISTORY — DX: Unspecified severe protein-calorie malnutrition: E43

## 2013-10-27 LAB — BASIC METABOLIC PANEL
BUN: 53 mg/dL — ABNORMAL HIGH (ref 6–23)
CALCIUM: 7.7 mg/dL — AB (ref 8.4–10.5)
CO2: 28 mEq/L (ref 19–32)
Chloride: 99 mEq/L (ref 96–112)
Creatinine, Ser: 2.38 mg/dL — ABNORMAL HIGH (ref 0.50–1.35)
GFR calc Af Amer: 30 mL/min — ABNORMAL LOW (ref >90)
GFR, EST NON AFRICAN AMERICAN: 26 mL/min — AB (ref >90)
GLUCOSE: 86 mg/dL (ref 70–99)
Potassium: 4 mEq/L (ref 3.7–5.3)
Sodium: 138 mEq/L (ref 137–147)

## 2013-10-27 LAB — CBC
HCT: 30.8 % — ABNORMAL LOW (ref 39.0–52.0)
HEMOGLOBIN: 10.5 g/dL — AB (ref 13.0–17.0)
MCH: 31.3 pg (ref 26.0–34.0)
MCHC: 34.1 g/dL (ref 30.0–36.0)
MCV: 91.7 fL (ref 78.0–100.0)
Platelets: 312 10*3/uL (ref 150–400)
RBC: 3.36 MIL/uL — ABNORMAL LOW (ref 4.22–5.81)
RDW: 16.3 % — ABNORMAL HIGH (ref 11.5–15.5)
WBC: 9.7 10*3/uL (ref 4.0–10.5)

## 2013-10-27 MED ORDER — CIPROFLOXACIN HCL 250 MG PO TABS: 250.0000 mg | ORAL_TABLET | Freq: Two times a day (BID) | ORAL | Status: DC

## 2013-10-27 MED ORDER — CIPROFLOXACIN HCL 250 MG PO TABS
250.0000 mg | ORAL_TABLET | Freq: Two times a day (BID) | ORAL | Status: DC
  Administered 2013-10-27: 250 mg via ORAL
  Filled 2013-10-27: qty 1

## 2013-10-27 NOTE — Progress Notes (Signed)
Patient being discharged home. Patient given all discharge instructions, prescriptions, and follow up appointments. Patient and wife verbalize understanding of all discharge instructions. Patient's Port deaccessed and site is WNL.. Patient alert, oriented and in stable condition at the time of discharge. Patient being discharged home with home health RN and PT.

## 2013-10-27 NOTE — Consult Note (Signed)
Trevor Crane MRN: [id number] DOB/AGE: 10/14/1940 73 y.o. Primary Care Physician:No PCP Per Patient Admit date: 10/25/2013 Chief Complaint:  Chief Complaint  Patient presents with  . Hypotension   HPI: Pt is 73 year old male with past medical hx of HTN who presented to ER with c/o weakness.  HPI dates back to past few week when pt has developed failure to thrive after initation of Chemotherapy. Pt was having much reduced po intake . Pt presented to Er with weakness ans was found to be severely hypotensive. Pt was admitted for further care. 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 Pt today feels much  better. NO c/o chest pain/ Dyspnea No c/o syncope NO c/o hematuria No c/o fever/cough/chills   Past Medical History  Diagnosis Date  . Hypertension   . Hypercholesterolemia   . Panic attacks   . 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 Duodenal cancer   . Oral candidiasis   . Hiccups   . Severe malnutrition 10/27/2013        Family History  Problem Relation Age of Onset  . Cancer Mother     colon cancer  . Hypertension Father   . Hypertension Maternal Uncle   NO hx of ESRD  Social History:  reports that he has never smoked. He has quit using smokeless tobacco. He reports that he drinks about 0.6 ounces of alcohol per week. He reports that he does not use illicit drugs.   Allergies:  Allergies  Allergen Reactions  . Sulfa Antibiotics Itching and Rash    Medications Prior to Admission  Medication Sig Dispense Refill  . acetaminophen (TYLENOL) 325 MG tablet Take 325 mg by mouth every 6 (six) hours as needed for mild pain or moderate pain.      . calcium carbonate (TUMS - DOSED IN MG ELEMENTAL CALCIUM) 500 MG chewable tablet Chew 1 tablet by mouth 3 (three) times daily as needed for indigestion or heartburn.       . capecitabine (XELODA) 500 MG tablet Take  1,000 mg/m2 by mouth daily. 1000mg  twice a day with food for 7 days on, 7 days off for 6 cycles      . dexamethasone (DECADRON) 4 MG tablet Starting the day after chemo, take 2 tablets in the am and 2 tablets in the pm for 2 days. Then Stop. Take with food.  32 tablet  0  . Diphenhyd-Hydrocort-Nystatin (FIRST-DUKES MOUTHWASH) SUSP Use as directed 5 mLs in the mouth or throat 4 (four) times daily as needed.  400 mL  1  . diphenoxylate-atropine (LOMOTIL) 2.5-0.025 MG per tablet Take 1 tablet by mouth 4 (four) times daily as needed for diarrhea or loose stools.  30 tablet  1  . FLUoxetine (PROZAC) 20 MG capsule Take 20 mg by mouth every morning.       . fluticasone (FLONASE) 50 MCG/ACT nasal spray Place 1 spray into both nostrils daily as needed for allergies or rhinitis.      Marland Kitchen lidocaine-prilocaine (EMLA) cream Apply a quarter size amount to port site 1 hour prior to chemo. Do not rub in. Cover with plastic wrap.  30 g  3  . lisinopril (PRINIVIL,ZESTRIL) 5 MG tablet Take 5 mg by mouth every morning.       . Loratadine-Pseudoephedrine (CLARITIN-D 24 HOUR PO) Take 0.5 tablets by mouth as needed.       Marland Kitchen  LORazepam (ATIVAN) 1 MG tablet Take 1 mg by mouth 3 (three) times daily as needed for anxiety.      . methylcellulose (ARTIFICIAL TEARS) 1 % ophthalmic solution Place 1 drop into both eyes 2 (two) times daily as needed (dry eyes).      . metoCLOPramide (REGLAN) 5 MG tablet Starting the day after chemo, take 1 tablet four times a day x 48 hours. Then may take 1 tablet four times a day IF needed for nausea/vomiting.  60 tablet  3  . omeprazole (PRILOSEC) 20 MG capsule Take 20 mg by mouth daily.      . ondansetron (ZOFRAN) 4 MG tablet Take 4 mg by mouth every 8 (eight) hours as needed for nausea or vomiting.      Marland Kitchen oxyCODONE (OXY IR/ROXICODONE) 5 MG immediate release tablet Take 1-2 tablets (5-10 mg total) by mouth every 4 (four) hours as needed for severe pain.  100 tablet  0  . pantoprazole (PROTONIX) 40 MG  tablet Take 40 mg by mouth daily.      . prochlorperazine (COMPAZINE) 10 MG tablet Take 1 tablet (10 mg total) by mouth 4 (four) times daily as needed for nausea or vomiting.  60 tablet  3  . simvastatin (ZOCOR) 40 MG tablet Take 40 mg by mouth at bedtime.           [id number] from the symptoms mentioned above,there are no other symptoms referable to all systems reviewed.  . cefTRIAXone (ROCEPHIN)  IV  1 g Intravenous Q24H  . enoxaparin (LOVENOX) injection  30 mg Subcutaneous Q24H  . FLUoxetine  20 mg Oral q morning - 10a  . loratadine  10 mg Oral Daily  . pantoprazole  40 mg Oral Daily  . simvastatin  40 mg Oral QHS      Physical Exam: Vital signs in last 24 hours: Temp:  [98 F (36.7 C)-98.5 F (36.9 C)] 98.1 F (36.7 C) (01/28 0800) Pulse Rate:  [46-115] 70 (01/28 0800) Resp:  [12-24] 16 (01/28 0800) BP: (81-106)/(37-75) 96/50 mmHg (01/28 0800) SpO2:  [80 %-100 %] 99 % (01/28 0800) Weight change:  Last BM Date: 10/26/13  Intake/Output from previous day: 01/27 0701 - 01/28 0700 In: 2522.9 [I.V.:2472.9; IV Piggyback:50] Out: 992 [Urine:900; Drains:90; Stool:2]     Physical Exam: General- pt is awake,alert, oriented to time place and person Resp- No acute REsp distress, CTA B/L NO Rhonchi CVS- S1S2 regular ij rate and rhythm GIT- BS+, soft, NT, ND EXT- NO LE Edema, Cyanosis CNS- CN 2-12 grossly intact. Moving all 4 extremities Psych- normal mood and affect    Lab Results: CBC  Recent Labs  10/26/13 0437 10/27/13 0436  WBC 7.9 9.7  HGB 10.6* 10.5*  HCT 31.1* 30.8*  PLT 317 312    BMET  Recent Labs  10/26/13 1343 10/27/13 0436  NA 137 138  K 4.8 4.0  CL 103 99  CO2 19 28  GLUCOSE 101* 86  BUN 71* 53*  CREATININE 3.36* 2.38*  CALCIUM 8.0* 7.7*   Trend Creat  2015 5.77=>4.02==>3.37==>2.38  2014   1.74--2.17    MICRO Recent Results (from the past 240 hour(s))  URINE CULTURE     Status: None   Collection Time    10/25/13  2:20 PM       Result Value Range Status   Specimen Description URINE, CLEAN CATCH   Final   Special Requests NONE   Final   Culture  Setup Time  Final   Value: 10/26/2013 00:24     Performed at [facility]     Final   Value: NO GROWTH     Performed at [facility]   Culture     Final   Value: NO GROWTH     Performed at [facility]   Report Status 10/26/2013 FINAL   Final  CULTURE, BLOOD (ROUTINE X 2)     Status: None   Collection Time    10/25/13  2:37 PM      Result Value Range Status   Specimen Description BLOOD RIGHT ANTECUBITAL   Final   Special Requests     Final   Value: BOTTLES DRAWN AEROBIC AND ANAEROBIC AEB=8CC ANA=6CC IMMUNE:COMPROMISED   Culture NO GROWTH 2 DAYS   Final   Report Status PENDING   Incomplete  CULTURE, BLOOD (ROUTINE X 2)     Status: None   Collection Time    10/25/13  2:37 PM      Result Value Range Status   Specimen Description BLOOD LEFT ANTECUBITAL   Final   Special Requests     Final   Value: BOTTLES DRAWN AEROBIC AND ANAEROBIC AEB=10CC ANA=8CC IMMUNE:COMPROMISED   Culture NO GROWTH 2 DAYS   Final   Report Status PENDING   Incomplete  MRSA PCR SCREENING     Status: None   Collection Time    10/25/13  6:30 PM      Result Value Range Status   MRSA by PCR NEGATIVE  NEGATIVE Final   Comment:            The GeneXpert MRSA Assay (FDA     approved for NASAL specimens     only), is one component of a     comprehensive MRSA colonization     surveillance program. It is not     intended to diagnose MRSA     infection nor to guide or     monitor treatment for     MRSA infections.      Lab Results  Component Value Date   CALCIUM 7.7* 10/27/2013   PHOS 2.7 09/17/2013  alb 2.0 Corrected Calcium 7.70+ 1.6=9.30    Impression: 1)Renal  AKI secondary to Pre renal/ ATN               AKI on CKD               AKI improving               CKD stage 3 .               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   2)Hypotension-now better  3)Anemia HGb at goal (9--11)  4)CKD Mineral-Bone Disorder Calcium when corrected for Low albumin is at goal Phosphorus at goal.   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

## 2013-10-27 NOTE — Discharge Summary (Signed)
The patient was seen and examined. He was discussed with nurse practitioner, Ms. Renard Hamper. Agree with her assessment and plan. Agree with her exam. In addition, his abdomen was soft without tenderness or distention; bowel sounds were present. The patient is much improved clinically. His renal function has returned to close to baseline. His blood pressure is now consistently above 95 systolically. The patient was admitted and treated for sepsis as a consequence of the urinary tract infection. He was hypotensive secondary to volume depletion/hypovolemia and possibly sepsis, but doubt septic shock. His hypotension was also exacerbated by lisinopril. He was treated with Rocephin for 2 days and discharged on 12 more days of ciprofloxacin. He will followup with oncology as scheduled on 11/01/2013. He was instructed to continue the antibiotic through completion and to avoid dehydration. He was also instructed not take lisinopril for 2 reasons: Recent hyperkalemia and recent hypotension. Would recommend a followup basic metabolic panel and CBC at his followup appointments. Home health physical therapy has been ordered.

## 2013-10-27 NOTE — Discharge Summary (Signed)
Physician Discharge Summary  Trevor Crane ZYS:[id number] DOB: 29-Jun-1941 DOA: 10/25/2013  PCP: No PCP Per Patient  Admit date: 10/25/2013 Discharge date: 10/27/2013  Time spent: 40 minutes minutes  Recommendations for Outpatient Follow-up:  1. Has appointment with oncology 11/01/13 for evaluation and possible resumption of chemo. Recommend BMET to track renal function 2. Dr. Theador Hawthorne om 1-2 weeks for evaluation of renal funtion  Discharge Diagnoses:  Principal Problem:   Sepsis Active Problems:   Biliary obstruction due to malignant neoplasm   Hydronephrosis of right kidney   Duodenal cancer   UTI (lower urinary tract infection)   Volume depletion   Acute on chronic renal failure   Renal mass   Hyponatremia   Oral candidiasis   Hypertension   Hypercholesterolemia   Panic attacks   Severe dehydration   Metabolic acidosis   Hyperkalemia   Severe malnutrition   Discharge Condition: stab;e  Diet recommendation: regular  Filed Weights   10/25/13 1809  Weight: 80 kg (176 lb 5.9 oz)    History of present illness:  Trevor Crane is a 73 y.o. 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 On evaluation in the emergency department blood pressure was found to be 76/40 with a heart rate of 89 and urine was consistent with a urinary tract infection.  Patient underwent his chemotherapy previous week as scheduled. He was seen in the clinic on that day and found to be hypotensive and dehydrated. He was given a liter of fluid and he felt much better. He stated he did well for one day quickly developed generalized weakness. He denied any abdominal pain nausea vomiting diarrhea. He has a biliary drain which he  said had been draining moderate amounts since its insertion over a month ago. Associated symptoms included anorexia and hiccups. He denied chest pain palpitation headache dizziness. He denied fever chills or recent sick contacts. He was scheduled to be seen for chemotherapy on day of admission but was unable to stand up to get to the doctor's office. EMS was called and he was transported to the emergency room. Hospital Course:  Sepsis: likely related to urinary tract infection in the setting of severe volume depletion. Admitted and provided with vigorous IV fluids and Rocephin.  White count, lactic acid and cortisol levels all within normal limits. Blood cultures with no growth. He remained afebrile and non-toxic appearing. Home lisinopril held. He quickly improved. IV changed to sodium bicarb in sterile water due to metabolic acidosis which is resolved on discharge. At discharge VSS, no leukocytosis, he is afebrile and non-toxic appearing. Will discharge with cipro for 2 more days.   Active Problems:   Acute on chronic renal failure: Likely related to decreased by mouth intake in the setting of chemotherapy and nausea as well as a component of ATN secondary to hypotension. Given IV fluids as above. Lisinopril held. Creatinine quickly trended downward. At discharge creatine 2.38 from 4.02 on admission. Renal US yields continued right hydronephrosis in spite of stent. Evaluated by Dr Theador Hawthorne with nephrology. He opined discontinue lisinopril at discharge and follow up OP 1-2 weeks. Recommend BMET in 1 week for evaluation of renal function. Urine output remained  good.    Metabolic acidosis: likely related to renal failure. Lactic acid within normal limits. IV fluids changed as  above. Resolved at discharge.   Hyperkalemia: related to #2. kayexalate given. EKG without changes. Resolved at discharge  UTI: Urine cultures with no growth.Recieved  Rocephin for 2 doses. Will discharge with cipro for 2 days.     Hyponatremia: Related to volume depletion secondary to decreased by mouth intake. IV fluids given. Resolved at discharge.   Oral candidiasis: Related to chemotherapy. Will continue home medication   Hypertension: See #1. Patient is on lisinopril at home. Will discontinue this at discharge. Oncology to follow BP   Biliary obstruction due to malignant neoplasm: Status post percutaneous biliary drainage. Stable   Hydronephrosis of right kidney: continues in spite of  right ureteral stent. Urine output good. Evaluated by nephrology. Will follow up with nephrology 1-2 weeks. See #2.   Duodenal cancer: Has had one cycle of chemotherapy. Scheduled for second cycle 11/01/13.   Volume depletion: Related to chemotherapy. Resolved at discharge.  See #1.  Hypercholesterolemia: Continue home medication  Panic attacks: Remained stable at baseline. Continue home   Procedures: Renal US  10/26/13: Right hydronephrosis despite presence of a ureteral stent and/or nephrostomy tube. Question small bilateral renal cysts    Consultations:  Oncology  nephrology  Discharge Exam: Filed Vitals:   10/27/13 0800  BP: 96/50  Pulse: 70  Temp: 98.1 F (36.7 C)  Resp: 16    General: well nourished ambulating in room without problem Cardiovascular: S1 and S2. i hear no murmur gallup or rub. There is no LE edema Respiratory: Normal effort. BS are clear to auscultation bilaterally. i hear no wheeze or rhonchi  Discharge Instructions   Future Appointments Provider Department Dept Phone   11/01/2013 9:30 AM [place], PA-C [place] [phone]   11/01/2013 9:45 AM [place] [phone]   11/15/2013 9:30 AM [facility]ing Provider [place] [phone]   11/15/2013 10:15 AM [place] [phone]       Medication List    STOP taking these medications       lisinopril 5 MG tablet  Commonly known as:   PRINIVIL,ZESTRIL     pantoprazole 40 MG tablet  Commonly known as:  PROTONIX      TAKE these medications       acetaminophen 325 MG tablet  Commonly known as:  TYLENOL  Take 325 mg by mouth every 6 (six) hours as needed for mild pain or moderate pain.     calcium carbonate 500 MG chewable tablet  Commonly known as:  TUMS - dosed in mg elemental calcium  Chew 1 tablet by mouth 3 (three) times daily as needed for indigestion or heartburn.     capecitabine 500 MG tablet  Commonly known as:  XELODA  Take 1,000 mg/m2 by mouth daily. 1000mg  twice a day with food for 7 days on, 7 days off for 6 cycles     ciprofloxacin 250 MG tablet  Commonly known as:  CIPRO  Take 1 tablet (250 mg total) by mouth 2 (two) times daily.     CLARITIN-D 24 HOUR PO  Take 0.5 tablets by mouth as needed.     dexamethasone 4 MG tablet  Commonly known as:  DECADRON  Starting the day after chemo, take 2 tablets in the am and 2 tablets in the pm for 2 days. Then Stop. Take with food.     diphenoxylate-atropine 2.5-0.025 MG per tablet  Commonly known as:  LOMOTIL  Take 1 tablet  by mouth 4 (four) times daily as needed for diarrhea or loose stools.     FIRST-DUKES MOUTHWASH Susp  Use as directed 5 mLs in the mouth or throat 4 (four) times daily as needed.     FLUoxetine 20 MG capsule  Commonly known as:  PROZAC  Take 20 mg by mouth every morning.     fluticasone 50 MCG/ACT nasal spray  Commonly known as:  FLONASE  Place 1 spray into both nostrils daily as needed for allergies or rhinitis.     lidocaine-prilocaine cream  Commonly known as:  EMLA  Apply a quarter size amount to port site 1 hour prior to chemo. Do not rub in. Cover with plastic wrap.     LORazepam 1 MG tablet  Commonly known as:  ATIVAN  Take 1 mg by mouth 3 (three) times daily as needed for anxiety.     methylcellulose 1 % ophthalmic solution  Commonly known as:  ARTIFICIAL TEARS  Place 1 drop into both eyes 2 (two) times daily as  needed (dry eyes).     metoCLOPramide 5 MG tablet  Commonly known as:  REGLAN  Starting the day after chemo, take 1 tablet four times a day x 48 hours. Then may take 1 tablet four times a day IF needed for nausea/vomiting.     omeprazole 20 MG capsule  Commonly known as:  PRILOSEC  Take 20 mg by mouth daily.     ondansetron 4 MG tablet  Commonly known as:  ZOFRAN  Take 4 mg by mouth every 8 (eight) hours as needed for nausea or vomiting.     oxyCODONE 5 MG immediate release tablet  Commonly known as:  Oxy IR/ROXICODONE  Take 1-2 tablets (5-10 mg total) by mouth every 4 (four) hours as needed for severe pain.     prochlorperazine 10 MG tablet  Commonly known as:  COMPAZINE  Take 1 tablet (10 mg total) by mouth 4 (four) times daily as needed for nausea or vomiting.     simvastatin 40 MG tablet  Commonly known as:  ZOCOR  Take 40 mg by mouth at bedtime.       Allergies  Allergen Reactions  . Sulfa Antibiotics Itching and Rash       Follow-up Information   Follow up with KEFALAS,THOMAS, PA-C On 11/01/2013. (at 9:30 for appt and chemotherapy.  Bring Xeloda pills with him)    Specialty:  Physician Assistant   Contact information:   [place] [place] [address] [phone]       Follow up with [facility].   Contact information:   [place] [address] [phone]      Follow up with Liana Gerold, MD. Schedule an appointment as soon as possible for a visit in 2 weeks. (for evaluation of renal fucntion)    Specialty:  Nephrology   Contact information:   [address] [place] [address] [phone]        The results of significant diagnostics from this hospitalization (including imaging, microbiology, ancillary and laboratory) are listed below for reference.    Significant Diagnostic Studies: Dg Chest 2 View  10/25/2013   CLINICAL DATA:  Hypotension  EXAM: CHEST  2 VIEW  COMPARISON:  None.  FINDINGS: Lungs are  essentially clear. No focal consolidation. No pleural effusion or pneumothorax.  The heart is normal in size.  Right chest power port terminating in the mid SVC.  Suspected external biliary drain overlying  the right upper abdomen, incompletely visualized.  IMPRESSION: No evidence of acute cardiopulmonary disease.   Electronically Signed   By: Julian Hy M.D.   On: 10/25/2013 14:54   US Renal  10/26/2013   CLINICAL DATA:  Acute renal failure, history hypertension  EXAM: RENAL/URINARY TRACT ULTRASOUND COMPLETE  COMPARISON:  09/17/2013 renal ultrasound, and CT abdomen pelvis 09/15/2013, chest radiograph 10/25/2013  FINDINGS: Right Kidney:  Length: 10.4 cm. Normal cortical thickness and echogenicity. Right hydronephrosis identified. Question right nephrostomy tube and/or stent. Question right nephrostomy tube on prior chest radiograph. Small exophytic hypoechoic question cystic lesion at the inferior pole, 1.4 x 1.4 x 1.9 cm, larger than the 1.2 x 1.2 x 1.2 cm on the previous exam. No additional mass or shadowing calcification.  Left Kidney:  Length: 10.8 cm. Normal cortical thickness. Borderline increased cortical echogenicity. Small question complicated versus deep cyst with artifacts exophytic at upper pole 12 x 12 x 11 mm noted on prior CT. No additional mass, hydronephrosis or shadowing calcifications.  Bladder:  Stent within urinary bladder. Bladder wall appears thickened of the bladder is incompletely distended. Right ureteral jet was not visualized during imaging, left ureteral jet seen.  IMPRESSION: Right hydronephrosis despite presence of a ureteral stent and/or nephrostomy tube.  Question small bilateral renal cysts.   Electronically Signed   By: Lavonia Dana M.D.   On: 10/26/2013 17:04   Ir Fluoro Guide Cv Line Right  10/01/2013   CLINICAL DATA:  Duodenal adenocarcinoma, needs access for chemotherapy  EXAM: TUNNELED PORT CATHETER PLACEMENT WITH ULTRASOUND AND FLUOROSCOPIC GUIDANCE  TECHNIQUE: The  procedure, risks, benefits, and alternatives were explained to the patient. Questions regarding the procedure were encouraged and answered. The patient understands and consents to the procedure. As antibiotic prophylaxis, cefazolin IV was ordered pre-procedure and administered intravenously within one hour of incision. Patency of the right IJ vein was confirmed with ultrasound with image documentation. An appropriate skin site was determined. Skin site was marked. Region was prepped using maximum barrier technique including cap and mask, sterile gown, sterile gloves, large sterile sheet, and Chlorhexidine as cutaneous antisepsis. The region was infiltrated locally with 1% lidocaine. Under real-time ultrasound guidance, the right IJ vein was accessed with a 21 gauge micropuncture needle; the needle tip within the vein was confirmed with ultrasound image documentation. Needle was exchanged over a 018 guidewire for transitional dilator which allowed passage of the [facility] wire into the IVC. Over this, the transitional dilator was exchanged for a 5 [place] MPA catheter. A small incision was made on the right anterior chest wall and a subcutaneous pocket fashioned. The power-injectable port was positioned and its catheter tunneled to the right IJ dermatotomy site. The MPA catheter was exchanged over an Amplatz wire for a peel-away sheath, through which the port catheter, which had been trimmed to the appropriate length, was advanced and positioned under fluoroscopy with its tip at the cavoatrial junction. Spot chest radiograph confirms good catheter position and no pneumothorax. The pocket was closed with deep interrupted and subcuticular continuous 3-0 Monocryl sutures. The port was flushed per protocol. The incisions were covered with Dermabond then covered with a sterile dressing. No immediate complication.  ANESTHESIA/SEDATION: Intravenous Fentanyl and Versed were administered as conscious sedation during continuous  cardiorespiratory monitoring by the radiology RN, with a total moderate sedation time of 15 minutes.  FLUOROSCOPY TIME:  6 seconds  IMPRESSION: Technically successful right IJ power-injectable port catheter placement. Ready for routine use.   Electronically Signed  By: Arne Cleveland M.D.   On: 10/01/2013 13:36   Ir US Guide Vasc Access Right  10/01/2013   CLINICAL DATA:  Duodenal adenocarcinoma, needs access for chemotherapy  EXAM: TUNNELED PORT CATHETER PLACEMENT WITH ULTRASOUND AND FLUOROSCOPIC GUIDANCE  TECHNIQUE: The procedure, risks, benefits, and alternatives were explained to the patient. Questions regarding the procedure were encouraged and answered. The patient understands and consents to the procedure. As antibiotic prophylaxis, cefazolin IV was ordered pre-procedure and administered intravenously within one hour of incision. Patency of the right IJ vein was confirmed with ultrasound with image documentation. An appropriate skin site was determined. Skin site was marked. Region was prepped using maximum barrier technique including cap and mask, sterile gown, sterile gloves, large sterile sheet, and Chlorhexidine as cutaneous antisepsis. The region was infiltrated locally with 1% lidocaine. Under real-time ultrasound guidance, the right IJ vein was accessed with a 21 gauge micropuncture needle; the needle tip within the vein was confirmed with ultrasound image documentation. Needle was exchanged over a 018 guidewire for transitional dilator which allowed passage of the [facility] wire into the IVC. Over this, the transitional dilator was exchanged for a 5 [place] MPA catheter. A small incision was made on the right anterior chest wall and a subcutaneous pocket fashioned. The power-injectable port was positioned and its catheter tunneled to the right IJ dermatotomy site. The MPA catheter was exchanged over an Amplatz wire for a peel-away sheath, through which the port catheter, which had been trimmed to the  appropriate length, was advanced and positioned under fluoroscopy with its tip at the cavoatrial junction. Spot chest radiograph confirms good catheter position and no pneumothorax. The pocket was closed with deep interrupted and subcuticular continuous 3-0 Monocryl sutures. The port was flushed per protocol. The incisions were covered with Dermabond then covered with a sterile dressing. No immediate complication.  ANESTHESIA/SEDATION: Intravenous Fentanyl and Versed were administered as conscious sedation during continuous cardiorespiratory monitoring by the radiology RN, with a total moderate sedation time of 15 minutes.  FLUOROSCOPY TIME:  6 seconds  IMPRESSION: Technically successful right IJ power-injectable port catheter placement. Ready for routine use.   Electronically Signed   By: Arne Cleveland M.D.   On: 10/01/2013 13:36    Microbiology: Recent Results (from the past 240 hour(s))  URINE CULTURE     Status: None   Collection Time    10/25/13  2:20 PM      Result Value Range Status   Specimen Description URINE, CLEAN CATCH   Final   Special Requests NONE   Final   Culture  Setup Time     Final   Value: 10/26/2013 00:24     Performed at [place]     Final   Value: NO GROWTH     Performed at [facility]   Culture     Final   Value: NO GROWTH     Performed at [facility]   Report Status 10/26/2013 FINAL   Final  CULTURE, BLOOD (ROUTINE X 2)     Status: None   Collection Time    10/25/13  2:37 PM      Result Value Range Status   Specimen Description BLOOD RIGHT ANTECUBITAL   Final   Special Requests     Final   Value: BOTTLES DRAWN AEROBIC AND ANAEROBIC AEB=8CC ANA=6CC IMMUNE:COMPROMISED   Culture NO GROWTH 2 DAYS   Final   Report Status PENDING   Incomplete  CULTURE,  BLOOD (ROUTINE X 2)     Status: None   Collection Time    10/25/13  2:37 PM      Result Value Range Status   Specimen Description BLOOD LEFT ANTECUBITAL   Final    Special Requests     Final   Value: BOTTLES DRAWN AEROBIC AND ANAEROBIC AEB=10CC ANA=8CC IMMUNE:COMPROMISED   Culture NO GROWTH 2 DAYS   Final   Report Status PENDING   Incomplete  MRSA PCR SCREENING     Status: None   Collection Time    10/25/13  6:30 PM      Result Value Range Status   MRSA by PCR NEGATIVE  NEGATIVE Final   Comment:            The GeneXpert MRSA Assay (FDA     approved for NASAL specimens     only), is one component of a     comprehensive MRSA colonization     surveillance program. It is not     intended to diagnose MRSA     infection nor to guide or     monitor treatment for     MRSA infections.     Labs: Basic Metabolic Panel:  Recent Labs Lab 10/22/13 1044 10/25/13 1437 10/26/13 0437 10/26/13 1343 10/27/13 0436  NA 129* 131* 134* 137 138  K 5.2 4.8 6.4* 4.8 4.0  CL 88* 92* 102 103 99  CO2 21 20 16* 19 28  GLUCOSE 125* 105* 98 101* 86  BUN 66* 94* 80* 71* 53*  CREATININE 5.15* 5.77* 4.02* 3.36* 2.38*  CALCIUM 10.0 8.1* 8.0* 8.0* 7.7*   Liver Function Tests:  Recent Labs Lab 10/22/13 1044 10/25/13 1437 10/26/13 0437  AST 28 20 32  ALT 22 16 13   ALKPHOS 217* 148* 126*  BILITOT 0.9 0.6 0.5  PROT 7.7 5.9* 5.2*  ALBUMIN 3.1* 2.4* 2.0*    Recent Labs Lab 10/25/13 1437  LIPASE 61*   No results found for this basename: AMMONIA,  in the last 168 hours CBC:  Recent Labs Lab 10/22/13 1044 10/25/13 1437 10/26/13 0437 10/27/13 0436  WBC 19.9* 13.8* 7.9 9.7  NEUTROABS 16.6* 11.1*  --   --   HGB 14.1 11.6* 10.6* 10.5*  HCT 40.5 33.4* 31.1* 30.8*  MCV 90.2 90.3 91.2 91.7  PLT 534* 369 317 312   Cardiac Enzymes: No results found for this basename: CKTOTAL, CKMB, CKMBINDEX, TROPONINI,  in the last 168 hours BNP: BNP (last 3 results) No results found for this basename: PROBNP,  in the last 8760 hours CBG: No results found for this basename: GLUCAP,  in the last 168 hours     Signed:  Radene Gunning  [facility] 10/27/2013, 10:26 AM

## 2013-10-31 LAB — CULTURE, BLOOD (ROUTINE X 2)
Culture: NO GROWTH
Culture: NO GROWTH

## 2013-10-31 NOTE — Progress Notes (Signed)
No PCP Per Patient [address] [place] [address]  Duodenal cancer  CURRENT THERAPY: S/P Cycle 1 of Oxaliplatin and Xeloda 1000 mg BID 7 days on and 7 days off  INTERVAL HISTORY: Trevor Crane 73 y.o. 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 Laural Golden.   10/11/2013 -  Chemotherapy Oxaliplatin and Xeloda (1000 mg BID 7 days on and 7 days off)   10/25/2013 - 10/27/2013 Hospital Admission Poor oral intake and failure to thrive.  Improved during hospitalization and discharged   Trevor Crane was admitted to the [facility] on 1/26 and discharged on 1/28.  He received 6 L of IV fluids.  His BP remained hypotensive despite D/C of all BP medications.  He recovered nicely from his hospitalization and discharged. He is presently on Cipro PO x 12 days for UTI.  This was started on 1/28 and he was strongly urged to complete this course of antibiotics.   I personally reviewed and went over laboratory results with the patient.  The results follow below.  Trevor Crane is very concerned about continuing chemotherapy now that he is feeling better.  He questions a dose reduction in chemotherapy, which I have deferred to Dr. Barnet Glasgow.  Read below for information regarding this discussion.    I have presented the patient's case to Dr. Barnet Glasgow.  He does not feel that a dose reduction following cycle 1 of chemotherapy is indicated due to his hospitalization being secondary to oral candidiasis causing him to not drink or eat leading to acute renal failure, dehydration, and failure to thrive.  Prophylactically, we can provide Trevor Crane with antifungal medication to hopefully prevent this complication.   A long discussion between Trevor Crane, his wife, Dr. Barnet Glasgow, and myself regarding the patient's  chemotherapy regimen as the goal of therapy is to provide the patient with an opportunity for surgical resection of tumor.  Dr. Barnet Glasgow recommends against dose reduction.  Trevor Crane was not too pleased with information and he has decided to consider his options regarding treatment versus comfort care.    As a result, all chemotherapy is on hold until Trevor Crane considers his options.    I spent some extra time with the patient and his wife after Dr. Barnet Glasgow left the exam room and answered all of their questions.  Trevor Crane is advised that there is not a right or wrong decision at this time and we will support his decision either way.  The [facility] family is appreciative of this time.    Past Medical History  Diagnosis Date  . Hypertension   . Hypercholesterolemia   . Panic attacks   . 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 Duodenal cancer   . Oral candidiasis   . Hiccups   . Severe malnutrition 10/27/2013    has Biliary obstruction; Biliary obstruction due to malignant neoplasm; ARF (acute renal failure); Hydronephrosis of right kidney; Cholestatic jaundice; Duodenal cancer; Sepsis; UTI (lower urinary tract infection); Volume depletion; Acute on chronic renal failure; Renal mass; Hyponatremia; Oral candidiasis; Hypertension; Hypercholesterolemia; Panic attacks; Severe dehydration; Metabolic acidosis; Hyperkalemia; and Severe malnutrition on his problem list.     is allergic to sulfa antibiotics.  Trevor Crane does not currently have medications on file.  Past Surgical History  Procedure Laterality Date  . Esophagogastroduodenoscopy (egd) with propofol N/A 09/17/2013    Procedure: ESOPHAGOGASTRODUODENOSCOPY (EGD) WITH PROPOFOL;  Surgeon: Rogene Houston, MD;  Location: [facility];  Service: Gastroenterology;  Laterality: N/A;  . Ercp N/A 09/17/2013    Procedure: ATTEMPTED ENDOSCOPIC RETROGRADE CHOLANGIOPANCREATOGRAPHY (ERCP);  Surgeon: Rogene Houston, MD;  Location: [facility];  Service:  Gastroenterology;  Laterality: N/A;  . Esophageal biopsy N/A 09/17/2013    Procedure: BIOPSY;  Surgeon: Rogene Houston, MD;  Location: [facility];  Service: Gastroenterology;  Laterality: N/A;    Denies any headaches, dizziness, double vision, fevers, chills, night sweats, nausea, vomiting, diarrhea, constipation, chest pain, heart palpitations, shortness of breath, blood in stool, black tarry stool, urinary pain, urinary burning, urinary frequency, hematuria.   PHYSICAL EXAMINATION  ECOG PERFORMANCE STATUS: 2 - Symptomatic, <50% confined to bed  Filed Vitals:   11/01/13 1034  BP: 107/70  Pulse: 91  Temp: 98 F (36.7 C)  Resp: 18    GENERAL:alert, no distress, comfortable, cooperative and smiling SKIN: skin color, texture, turgor are normal, no rashes or significant lesions HEAD: Normocephalic, No masses, lesions, tenderness or abnormalities EYES: normal, PERRLA, EOMI, Conjunctiva are pink and non-injected EARS: External ears normal OROPHARYNX:mucous membranes are moist  NECK: supple, trachea midline LYMPH:  not examined BREAST:not examined LUNGS: clear to auscultation  HEART: regular rate & rhythm, no murmurs and no gallops ABDOMEN:abdomen soft and normal bowel sounds BACK: Back symmetric, no curvature. EXTREMITIES:less then 2 second capillary refill, no cyanosis  NEURO: alert & oriented x 3 with fluent speech, no focal motor/sensory deficits, in a wheelchair, but ambulating.    LABORATORY DATA:  CBC    Component Value Date/Time   WBC 9.7 10/27/2013 0436   RBC 3.36* 10/27/2013 0436   RBC 3.83* 09/28/2013 1338   HGB 10.5* 10/27/2013 0436   HCT 30.8* 10/27/2013 0436   PLT 312 10/27/2013 0436   MCV 91.7 10/27/2013 0436   MCH 31.3 10/27/2013 0436   MCHC 34.1 10/27/2013 0436   RDW 16.3* 10/27/2013 0436   LYMPHSABS 1.5 10/25/2013 1437   MONOABS 0.9 10/25/2013 1437   EOSABS 0.2 10/25/2013 1437   BASOSABS 0.0 10/25/2013 1437      Chemistry      Component Value Date/Time   NA  138 10/27/2013 0436   K 4.0 10/27/2013 0436   CL 99 10/27/2013 0436   CO2 28 10/27/2013 0436   BUN 53* 10/27/2013 0436   CREATININE 2.38* 10/27/2013 0436      Component Value Date/Time   CALCIUM 7.7* 10/27/2013 0436   ALKPHOS 126* 10/26/2013 0437   AST 32 10/26/2013 0437   ALT 13 10/26/2013 0437   BILITOT 0.5 10/26/2013 0437     Results for Trevor Crane, Trevor Crane (MRN [id number]) as of 10/31/2013 09:47  Ref. Range 10/11/2013 09:35  CA 19-9 Latest Range: <35.0 U/mL 508.5 (H)      ASSESSMENT:  1. 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  Chemotherapy on hold at present time. 2. Stage 3 Chronic Renal Disease 3. Hypotension, stable  Patient Active Problem List   Diagnosis Date Noted  . Severe malnutrition 10/27/2013  . Metabolic acidosis 97/98/9211  . Hyperkalemia 10/26/2013  . Sepsis 10/25/2013  . UTI (lower urinary tract infection) 10/25/2013  . Volume depletion 10/25/2013  . Acute on chronic renal failure 10/25/2013  . Renal mass 10/25/2013  . Hyponatremia 10/25/2013  .  Oral candidiasis 10/25/2013  . Severe dehydration 10/25/2013  . Hypertension   . Hypercholesterolemia   . Panic attacks   . Duodenal cancer 09/28/2013  . ARF (acute renal failure) 09/16/2013  . Hydronephrosis of right kidney 09/16/2013  . Cholestatic jaundice 09/16/2013  . Biliary obstruction 09/15/2013  . Biliary obstruction due to malignant neoplasm 09/15/2013     PLAN:  1. I personally reviewed and went over laboratory results with the patient.  The results follow below. 2. Chemotherapy today as scheduled 3. Pre-chemo labs: CBC diff, CMET, CA 19-9 4. Referral to IR for Biliary drain exchange, to be done every 6 weeks 5. HOLD CHEMOTHERAPY AT THIS TIME.  He wishes to consider his options of treatment versus comfort care.  He will contact us about his decision 6. Return in 2-3 weeks for follow-up   THERAPY PLAN:    Treatment plan was to complete chemotherapy in hope of making Trevor Crane a surgical candidate and improving his chance of cure.  He is considering his options regarding treatment as he has not been too pleased with his first cycle of chemotherapy and subsequent hospitalization, despite admission not directly related to chemotherapy, but definitely indirectly related.  All questions were answered. The patient knows to call the clinic with any problems, questions or concerns. We can certainly see the patient much sooner if necessary.  Patient and plan discussed with Dr. Farrel Gobble and he is in agreement with the aforementioned.   Trevor Crane

## 2013-11-01 ENCOUNTER — Encounter (HOSPITAL_COMMUNITY): Payer: Medicare Other | Attending: Hematology and Oncology | Admitting: Oncology

## 2013-11-01 ENCOUNTER — Encounter (HOSPITAL_COMMUNITY): Payer: Medicare Other

## 2013-11-01 ENCOUNTER — Encounter (HOSPITAL_BASED_OUTPATIENT_CLINIC_OR_DEPARTMENT_OTHER): Payer: Medicare Other

## 2013-11-01 ENCOUNTER — Encounter (HOSPITAL_COMMUNITY): Payer: Self-pay | Admitting: Oncology

## 2013-11-01 VITALS — BP 107/70 | HR 91 | Temp 98.0°F | Resp 18 | Wt 170.5 lb

## 2013-11-01 DIAGNOSIS — C801 Malignant (primary) neoplasm, unspecified: Secondary | ICD-10-CM

## 2013-11-01 DIAGNOSIS — C170 Malignant neoplasm of duodenum: Secondary | ICD-10-CM | POA: Insufficient documentation

## 2013-11-01 DIAGNOSIS — G4700 Insomnia, unspecified: Secondary | ICD-10-CM | POA: Insufficient documentation

## 2013-11-01 DIAGNOSIS — K831 Obstruction of bile duct: Secondary | ICD-10-CM | POA: Insufficient documentation

## 2013-11-01 DIAGNOSIS — R630 Anorexia: Secondary | ICD-10-CM | POA: Insufficient documentation

## 2013-11-01 DIAGNOSIS — R112 Nausea with vomiting, unspecified: Secondary | ICD-10-CM | POA: Insufficient documentation

## 2013-11-01 LAB — CBC WITH DIFFERENTIAL/PLATELET
BASOS ABS: 0.1 10*3/uL (ref 0.0–0.1)
BASOS PCT: 1 % (ref 0–1)
EOS ABS: 0.5 10*3/uL (ref 0.0–0.7)
EOS PCT: 5 % (ref 0–5)
HCT: 36.6 % — ABNORMAL LOW (ref 39.0–52.0)
Hemoglobin: 12.2 g/dL — ABNORMAL LOW (ref 13.0–17.0)
Lymphocytes Relative: 25 % (ref 12–46)
Lymphs Abs: 2.7 10*3/uL (ref 0.7–4.0)
MCH: 31.4 pg (ref 26.0–34.0)
MCHC: 33.3 g/dL (ref 30.0–36.0)
MCV: 94.3 fL (ref 78.0–100.0)
Monocytes Absolute: 1.2 10*3/uL — ABNORMAL HIGH (ref 0.1–1.0)
Monocytes Relative: 12 % (ref 3–12)
Neutro Abs: 6.1 10*3/uL (ref 1.7–7.7)
Neutrophils Relative %: 58 % (ref 43–77)
PLATELETS: 380 10*3/uL (ref 150–400)
RBC: 3.88 MIL/uL — ABNORMAL LOW (ref 4.22–5.81)
RDW: 16.5 % — AB (ref 11.5–15.5)
WBC: 10.5 10*3/uL (ref 4.0–10.5)

## 2013-11-01 LAB — COMPREHENSIVE METABOLIC PANEL
ALBUMIN: 2.4 g/dL — AB (ref 3.5–5.2)
ALT: 16 U/L (ref 0–53)
AST: 22 U/L (ref 0–37)
Alkaline Phosphatase: 154 U/L — ABNORMAL HIGH (ref 39–117)
BUN: 18 mg/dL (ref 6–23)
CALCIUM: 8.8 mg/dL (ref 8.4–10.5)
CO2: 27 meq/L (ref 19–32)
CREATININE: 1.97 mg/dL — AB (ref 0.50–1.35)
Chloride: 95 mEq/L — ABNORMAL LOW (ref 96–112)
GFR calc Af Amer: 38 mL/min — ABNORMAL LOW (ref >90)
GFR calc non Af Amer: 33 mL/min — ABNORMAL LOW (ref >90)
Glucose, Bld: 101 mg/dL — ABNORMAL HIGH (ref 70–99)
Potassium: 4.3 mEq/L (ref 3.7–5.3)
SODIUM: 135 meq/L — AB (ref 137–147)
TOTAL PROTEIN: 6.5 g/dL (ref 6.0–8.3)
Total Bilirubin: 0.6 mg/dL (ref 0.3–1.2)

## 2013-11-01 LAB — CANCER ANTIGEN 19-9: CA 19-9: 613.1 U/mL — ABNORMAL HIGH (ref <35.0)

## 2013-11-01 NOTE — Progress Notes (Signed)
Labs drawn today for cbc/diff,cmp,ca19-9

## 2013-11-01 NOTE — Patient Instructions (Signed)
[place] Discharge Instructions  RECOMMENDATIONS MADE BY THE CONSULTANT AND ANY TEST RESULTS WILL BE SENT TO YOUR REFERRING PHYSICIAN.  EXAM FINDINGS BY THE PHYSICIAN TODAY AND SIGNS OR SYMPTOMS TO REPORT TO CLINIC OR PRIMARY PHYSICIAN: Exam and findings as discussed by [name], PA- C. We will hold chemotherapy for now, hold xeloda also.  Call us and let us know what you want to do.  Report uncontrolled pain, fevers or other problems.  Will schedule an appointment with Interventional Radiology to see if they can get your drainage tubing open.  They will call with day and time of appointment.  MEDICATIONS PRESCRIBED:  Continue Duke's Mouthwash  INSTRUCTIONS/FOLLOW-UP: Return in 2  - 3 weeks.  Thank you for choosing [facility] to provide your oncology and hematology care.  To afford each patient quality time with our providers, please arrive at least 15 minutes before your scheduled appointment time.  With your help, our goal is to use those 15 minutes to complete the necessary work-up to ensure our physicians have the information they need to help with your evaluation and healthcare recommendations.    Effective January 1st, 2014, we ask that you re-schedule your appointment with our physicians should you arrive 10 or more minutes late for your appointment.  We strive to give you quality time with our providers, and arriving late affects you and other patients whose appointments are after yours.    Again, thank you for choosing [facility].  Our hope is that these requests will decrease the amount of time that you wait before being seen by our physicians.       _____________________________________________________________  Should you have questions after your visit to [facility], please contact our office at [phone] between the hours of 8:30 a.m. and 5:00 p.m.  Voicemails left after 4:30 p.m. will not be returned until  the following business day.  For prescription refill requests, have your pharmacy contact our office with your prescription refill request.

## 2013-11-02 ENCOUNTER — Other Ambulatory Visit: Payer: Self-pay | Admitting: Radiology

## 2013-11-03 ENCOUNTER — Ambulatory Visit (HOSPITAL_COMMUNITY)
Admission: RE | Admit: 2013-11-03 | Discharge: 2013-11-03 | Disposition: A | Discharge status: Home / Self Care | Payer: Medicare Other | Source: Ambulatory Visit | Attending: Oncology | Admitting: Oncology

## 2013-11-03 ENCOUNTER — Other Ambulatory Visit (HOSPITAL_COMMUNITY): Payer: Self-pay | Admitting: Oncology

## 2013-11-03 ENCOUNTER — Encounter (HOSPITAL_COMMUNITY): Payer: Self-pay

## 2013-11-03 ENCOUNTER — Encounter (HOSPITAL_COMMUNITY): Payer: Self-pay | Admitting: Pharmacy Technician

## 2013-11-03 DIAGNOSIS — K831 Obstruction of bile duct: Principal | ICD-10-CM

## 2013-11-03 DIAGNOSIS — I10 Essential (primary) hypertension: Secondary | ICD-10-CM | POA: Insufficient documentation

## 2013-11-03 DIAGNOSIS — C801 Malignant (primary) neoplasm, unspecified: Secondary | ICD-10-CM

## 2013-11-03 DIAGNOSIS — Z85828 Personal history of other malignant neoplasm of skin: Secondary | ICD-10-CM | POA: Insufficient documentation

## 2013-11-03 DIAGNOSIS — Z4689 Encounter for fitting and adjustment of other specified devices: Secondary | ICD-10-CM | POA: Insufficient documentation

## 2013-11-03 DIAGNOSIS — K8020 Calculus of gallbladder without cholecystitis without obstruction: Secondary | ICD-10-CM | POA: Insufficient documentation

## 2013-11-03 DIAGNOSIS — C241 Malignant neoplasm of ampulla of Vater: Secondary | ICD-10-CM | POA: Insufficient documentation

## 2013-11-03 DIAGNOSIS — Z87891 Personal history of nicotine dependence: Secondary | ICD-10-CM | POA: Insufficient documentation

## 2013-11-03 DIAGNOSIS — E7800 Pure hypercholesterolemia, unspecified: Secondary | ICD-10-CM | POA: Insufficient documentation

## 2013-11-03 DIAGNOSIS — K838 Other specified diseases of biliary tract: Secondary | ICD-10-CM | POA: Insufficient documentation

## 2013-11-03 HISTORY — PX: OTHER SURGICAL HISTORY: SHX169

## 2013-11-03 LAB — PROTIME-INR
INR: 1.08 (ref 0.00–1.49)
PROTHROMBIN TIME: 13.8 s (ref 11.6–15.2)

## 2013-11-03 MED ORDER — SODIUM CHLORIDE 0.9 % IV SOLN: INTRAVENOUS | Status: DC

## 2013-11-03 MED ORDER — MIDAZOLAM HCL 2 MG/2ML IJ SOLN
INTRAMUSCULAR | Status: AC
  Filled 2013-11-03: qty 4

## 2013-11-03 MED ORDER — HEPARIN SOD (PORK) LOCK FLUSH 100 UNIT/ML IV SOLN
500.0000 [IU] | Freq: Once | INTRAVENOUS | Status: AC
  Administered 2013-11-03: 500 [IU]
  Filled 2013-11-03: qty 5

## 2013-11-03 MED ORDER — PIPERACILLIN-TAZOBACTAM 3.375 G IVPB
3.3750 g | Freq: Once | INTRAVENOUS | Status: AC
  Administered 2013-11-03: 3.375 g via INTRAVENOUS
  Filled 2013-11-03: qty 50

## 2013-11-03 MED ORDER — SODIUM CHLORIDE 0.9 % IJ SOLN: 10.0000 mL | Freq: Once | INTRAMUSCULAR | Status: DC

## 2013-11-03 MED ORDER — LIDOCAINE HCL 1 % IJ SOLN
INTRAMUSCULAR | Status: AC
  Filled 2013-11-03: qty 20

## 2013-11-03 MED ORDER — IOHEXOL 300 MG/ML  SOLN
10.0000 mL | Freq: Once | INTRAMUSCULAR | Status: AC | PRN
  Administered 2013-11-03: 25 mL

## 2013-11-03 MED ORDER — FENTANYL CITRATE 0.05 MG/ML IJ SOLN
INTRAMUSCULAR | Status: AC
  Filled 2013-11-03: qty 4

## 2013-11-03 NOTE — Discharge Instructions (Signed)
Biliary Drainage Catheter Home Guide A biliary drainage catheter is a tube that is inserted through your skin into the bile ducts in your liver. The purpose of a biliary drainage catheter is to prevent backup of bile into the liver. Bile is a thick yellow or green fluid that helps digest fat in foods. Backup of bile can occur when there is a blockage preventing bile from moving from the bile ducts into your small intestine, as it normally should. This can occur from a tumor, gallstones, or scar tissue. There are three types of biliary drainage:  External biliary drainage With this type, bile is only drained into a collection bag outside your body (external collection bag).  Internal-external biliary drainage Bile is drained to an external collection bag as well as into your small intestine.  Internal biliary drainage Bile is only drained into your small intestine. HOW DO I CHANGE MY DRESSING? The dressing over the drain should be changed at least every other day or more frequently if needed to keep the dressing dry.  1. Wash your hands with soap and water. 2. Gently remove the old dressing. Avoid using scissors to remove the dressing because this may lead to accidental damage to the drain. 3. Once the dressing is removed, inspect the skin around the drain for redness, swelling, and foul smelling yellow or green discharge. 4. If the drain was sutured to the skin, inspect the suture to verify that it is still anchored in the skin. 5. Clean the skin around the insertion site with mild soap and warm water. Pat the area dry with a clean cloth. 6. Do not apply creams, ointments, or alcohol to the site. Allow the skin to air dry completely before applying a new dressing. 7. Use a drain sponge (4x4 split gauze) and place the drain through the slit. Cover the drain and the first gauze with a 4x4 gauze. The drain should rest on the gauze and not on the skin. 8. Tape the dressing to the skin. 9. Wash your  hands with soap and water. HOW DO I FLUSH A DRAIN NOT ATTACHED TO A BAG? Biliary drains should be flushed daily unless you are instructed otherwise by a health care provider. The end of the drain is closed using an IV cap to which a syringe can be directly connected.  1. Clean the IV cap with an alcohol swab and then screw the tip of a 10 ml normal saline syringe onto the IV cap. 2. Inject the saline over 5-10 seconds. If you feel resistance while injecting, stop immediately. 3. Remove the syringe from the cap. HOW DO I ATTACH A BAG TO MY DRAIN? If you are having trouble with your biliary drain, you may be directed by your health care provider to use bag drainage until you can be seen to fix the problem. You should always have a drainage bag and connecting tubing at home for this reason. If you do not, remember to ask for these at your next appointment.  1. Remove the bag and connecting tubing from their packaging. 2. Connect the funnel end of the tubing to the bag's cone-shaped stem. 3. Remove the IV cap from the biliary drain by unscrewing it and replace it with the screw-on end of the tubing. 4. Save the IV cap in a sealable plastic storage bag. HOW DO I EMPTY MY DRAINAGE BAG? The drainage bag should be emptied when it becomes 2/3 full or before you go to sleep. Most drainage bags  have a drainage valve at the bottom that allows them to be emptied easily. 1. Hold the bag over the toilet or basin (or measuring container, if you are directed to measure the drainage). 2. Unscrew the valve to open it, and allow the bag to drain. 3. Close the valve securely to avoid leakage, and wipe it clean with a tissue or disposable napkin. SEEK MEDICAL CARE IF:  Your pain gets worse after an initial improvement.  You have any questions about your tube.  Your redness, soreness, or swelling at the tube insertion site gets worse despite good cleaning.  Your skin breaks down around the tube.  You have  leakage of bile around the tube.  Your tube becomes blocked or clogged.  Your catheter is dislodged or comes out. SEEK IMMEDIATE MEDICAL CARE IF:  You have a fever.  You have chills or increased pain. MAKE SURE YOU:  Understand these instructions.  Will watch your condition.  Will get help right away if you are not doing well or get worse. Document Released: 07/07/2013 Document Reviewed: 05/24/2013 [facility] Patient Information 2014 [place], [place].

## 2013-11-03 NOTE — H&P (Signed)
Chief Complaint: "I need a drain exchanged" Referring Physician:Formanek/Kefalas HPI: Trevor Crane is an 73 y.o. male with invasive adenocarcinoma of the ampulla that caused biliary obstruction. He underwent bilary drain placement at [facility] in December as well as right ureteral stent for hydronephrosis. He had a Port placed here by our IR team on 1/2. He has received some treatment since all this. The wife reports his jaundice is resolved. She reports about 2 weeks ago, the suture broke on the drain and the drain has come out several inches, but not completely. Apparently there is still some bile draining from it and they are still able to flush. The wife reports there is also some bile draining at the skin site as well. I'm not sure how/why he hasn't been to [facility] to address this, but he is here today for biliary drain eval and exchange. His follow up labs have showed stable LFTs/Bili levels so I think the assumption is that the drain was still functioning well despite have retracted. He denies any issues with hs port. Feeling well otherwise, no recent fevers, chills.  Past Medical History:  Past Medical History  Diagnosis Date  . Hypertension   . Hypercholesterolemia   . Panic attacks   . 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 Duodenal cancer   . Oral candidiasis   . Hiccups   . Severe malnutrition 10/27/2013    Past Surgical History:  Past Surgical History  Procedure Laterality Date  . Esophagogastroduodenoscopy (egd) with propofol N/A 09/17/2013    Procedure: ESOPHAGOGASTRODUODENOSCOPY (EGD) WITH PROPOFOL;  Surgeon: Rogene Houston, MD;  Location: [facility];  Service: Gastroenterology;  Laterality: N/A;  . Ercp N/A 09/17/2013    Procedure: ATTEMPTED ENDOSCOPIC RETROGRADE CHOLANGIOPANCREATOGRAPHY (ERCP);  Surgeon: Rogene Houston, MD;  Location: [facility];  Service: Gastroenterology;  Laterality: N/A;  . Esophageal biopsy N/A 09/17/2013    Procedure: BIOPSY;  Surgeon: Rogene Houston, MD;  Location: [facility];  Service: Gastroenterology;  Laterality: N/A;    Family History:  Family History  Problem Relation Age of Onset  . Cancer Mother     colon cancer  . Hypertension Father   . Hypertension Maternal Uncle     Social History:  reports that he has never smoked. He has quit using smokeless tobacco. He reports that he drinks about 0.6 ounces of alcohol per week. He reports that he does not use illicit drugs.  Allergies:  Allergies  Allergen Reactions  . Sulfa Antibiotics Itching and Rash    Medications:   Medication List    ASK your doctor about these medications       acetaminophen 325 MG tablet  Commonly known as:  TYLENOL  Take 325 mg by mouth every 6 (six) hours as needed for mild pain or moderate pain.     calcium carbonate 500 MG chewable tablet  Commonly known as:  TUMS - dosed in mg elemental calcium  Chew 1 tablet by mouth 3 (three) times daily as needed for indigestion or heartburn.     capecitabine 500 MG tablet  Commonly known as:  XELODA  Take 1,000 mg/m2 by mouth daily. 1000mg  twice a day with food for 7 days on, 7 days off for 6 cycles     ciprofloxacin 250 MG tablet  Commonly known as:  CIPRO  Take 1 tablet (250 mg total) by mouth 2 (two) times daily.     CIPRO 250 MG tablet  Generic drug:  ciprofloxacin  250 mg 2 (two) times daily.     CLARITIN-D 24 HOUR PO  Take 0.5 tablets by mouth as needed.     dexamethasone 4 MG tablet  Commonly known as:  DECADRON  Starting the day after chemo, take 2 tablets in the am and 2 tablets in the pm for 2 days. Then Stop. Take with food.     diphenoxylate-atropine 2.5-0.025 MG per tablet  Commonly known as:  LOMOTIL  Take 1 tablet by mouth 4 (four) times daily as needed for diarrhea or loose stools.     FIRST-DUKES MOUTHWASH Susp  Use as directed 5 mLs in the mouth or throat 4 (four) times daily as needed.     FLUoxetine 20 MG capsule  Commonly known as:  PROZAC  Take 20 mg by mouth  every morning.     fluticasone 50 MCG/ACT nasal spray  Commonly known as:  FLONASE  Place 1 spray into both nostrils daily as needed for allergies or rhinitis.     lidocaine-prilocaine cream  Commonly known as:  EMLA  Apply a quarter size amount to port site 1 hour prior to chemo. Do not rub in. Cover with plastic wrap.     LORazepam 1 MG tablet  Commonly known as:  ATIVAN  Take 1 mg by mouth 3 (three) times daily as needed for anxiety.     methylcellulose 1 % ophthalmic solution  Commonly known as:  ARTIFICIAL TEARS  Place 1 drop into both eyes 2 (two) times daily as needed (dry eyes).     metoCLOPramide 5 MG tablet  Commonly known as:  REGLAN  Starting the day after chemo, take 1 tablet four times a day x 48 hours. Then may take 1 tablet four times a day IF needed for nausea/vomiting.     omeprazole 20 MG capsule  Commonly known as:  PRILOSEC  Take 20 mg by mouth daily.     ondansetron 4 MG tablet  Commonly known as:  ZOFRAN  Take 4 mg by mouth every 8 (eight) hours as needed for nausea or vomiting.     oxyCODONE 5 MG immediate release tablet  Commonly known as:  Oxy IR/ROXICODONE  Take 1-2 tablets (5-10 mg total) by mouth every 4 (four) hours as needed for severe pain.     oxyCODONE 5 MG immediate release tablet  Commonly known as:  Oxy IR/ROXICODONE  5 mg every 4 (four) hours as needed.     prochlorperazine 10 MG tablet  Commonly known as:  COMPAZINE  Take 1 tablet (10 mg total) by mouth 4 (four) times daily as needed for nausea or vomiting.     simvastatin 40 MG tablet  Commonly known as:  ZOCOR  Take 40 mg by mouth at bedtime.        Please HPI for pertinent positives, otherwise complete 10 system ROS negative.  Physical Exam: Temp: 98.2, HR: 86, BP: 101/67, RR: 16   General Appearance:  Alert, cooperative, no distress, appears stated age  Head:  Normocephalic, without obvious abnormality, atraumatic  ENT: Unremarkable  Neck: Supple, symmetrical,  trachea midline  Lungs:   Clear to auscultation bilaterally, no w/r/r, respirations unlabored without use of accessory muscles.  Chest Wall:  No tenderness or deformity  Heart:  Regular rate and rhythm, S1, S2 normal, no murmur, rub or gallop.  Abdomen:   Soft, non-tender, non distended. RUQ biliary drain is intact, but suture is broken and tube has clearly retracted back at least 6-8 inches. There  is clear bile in the drain as well as some staining on the dressing.  Extremities: Extremities normal, atraumatic, no cyanosis or edema  Pulses: 2+ and symmetric  Neurologic: Normal affect, no gross deficits.   Labs: CBC    Component Value Date/Time   WBC 10.5 11/01/2013 1005   RBC 3.88* 11/01/2013 1005   RBC 3.83* 09/28/2013 1338   HGB 12.2* 11/01/2013 1005   HCT 36.6* 11/01/2013 1005   PLT 380 11/01/2013 1005   MCV 94.3 11/01/2013 1005   MCH 31.4 11/01/2013 1005   MCHC 33.3 11/01/2013 1005   RDW 16.5* 11/01/2013 1005   LYMPHSABS 2.7 11/01/2013 1005   MONOABS 1.2* 11/01/2013 1005   EOSABS 0.5 11/01/2013 1005   BASOSABS 0.1 11/01/2013 1005    BMET    Component Value Date/Time   NA 135* 11/01/2013 1005   K 4.3 11/01/2013 1005   CL 95* 11/01/2013 1005   CO2 27 11/01/2013 1005   GLUCOSE 101* 11/01/2013 1005   BUN 18 11/01/2013 1005   CREATININE 1.97* 11/01/2013 1005   CALCIUM 8.8 11/01/2013 1005   GFRNONAA 33* 11/01/2013 1005   GFRAA 38* 11/01/2013 1005    Hepatic Function Panel     Component Value Date/Time   PROT 6.5 11/01/2013 1005   ALBUMIN 2.4* 11/01/2013 1005   AST 22 11/01/2013 1005   ALT 16 11/01/2013 1005   ALKPHOS 154* 11/01/2013 1005   BILITOT 0.6 11/01/2013 1005     Assessment/Plan Ampullary adenocarcinoma with biliary obstruction S/p Bili drain 12/23 at outside facility. For Image guided eval and exchange today. Given that his Bili is normal, he may be preferentially draining internally, or the drain may be inside just enough to provide decompression. Discussed with pt and wife that new drain, be it external  only or internal/external drain will be placed/exchanged. Explained procedure, risks, complications, use of sedation. Labs reviewed. Consent signed in chart  Ascencion Dike PA-C 11/03/2013, 10:27 AM

## 2013-11-03 NOTE — Procedures (Signed)
Interventional Radiology Procedure Note  Procedure:  1.) Cholangiogram through existing tube 2.) Crossed malignant obstruction 3.) Exchange for new 35F internal/external biliary drain Complications: None Recommendations: - Bag drainage x 24 hrs - Cap tube tomorrow at home - Follow up in 4 weeks or sooner if there are any issues  Signed,  Criselda Peaches, MD [facility] Rad[facility]

## 2013-11-08 ENCOUNTER — Telehealth (HOSPITAL_COMMUNITY): Payer: Self-pay | Admitting: Oncology

## 2013-11-08 ENCOUNTER — Inpatient Hospital Stay (HOSPITAL_COMMUNITY): Payer: Medicare Other

## 2013-11-08 ENCOUNTER — Other Ambulatory Visit (HOSPITAL_COMMUNITY): Payer: Self-pay | Admitting: Oncology

## 2013-11-08 ENCOUNTER — Ambulatory Visit (HOSPITAL_COMMUNITY): Payer: Medicare Other

## 2013-11-08 DIAGNOSIS — C170 Malignant neoplasm of duodenum: Secondary | ICD-10-CM

## 2013-11-08 DIAGNOSIS — F410 Panic disorder [episodic paroxysmal anxiety] without agoraphobia: Secondary | ICD-10-CM

## 2013-11-08 DIAGNOSIS — C801 Malignant (primary) neoplasm, unspecified: Secondary | ICD-10-CM

## 2013-11-08 DIAGNOSIS — K831 Obstruction of bile duct: Secondary | ICD-10-CM

## 2013-11-08 MED ORDER — LORAZEPAM 1 MG PO TABS: 1.0000 mg | ORAL_TABLET | Freq: Three times a day (TID) | ORAL | Status: DC | PRN

## 2013-11-08 MED ORDER — OXYCODONE HCL 5 MG PO TABS: 5.0000 mg | ORAL_TABLET | ORAL | Status: DC | PRN

## 2013-11-08 NOTE — Telephone Encounter (Signed)
Mrs. Garron notified and they will pick up prescription either this afternoon or tomorrow.

## 2013-11-08 NOTE — Telephone Encounter (Signed)
Message copied by Mellissa Kohut on Mon Nov 08, 2013 10:54 AM ------      Message from: [facility]      Created: Mon Nov 08, 2013 10:45 AM       Both Rx are in the drawer            Needs a refill on Ativan and Oxycodone ------

## 2013-11-15 ENCOUNTER — Ambulatory Visit (HOSPITAL_COMMUNITY): Payer: Medicare Other | Admitting: Oncology

## 2013-11-15 ENCOUNTER — Inpatient Hospital Stay (HOSPITAL_COMMUNITY): Payer: Medicare Other

## 2013-11-19 ENCOUNTER — Encounter (HOSPITAL_BASED_OUTPATIENT_CLINIC_OR_DEPARTMENT_OTHER): Payer: Medicare Other | Admitting: Oncology

## 2013-11-19 ENCOUNTER — Encounter (HOSPITAL_COMMUNITY): Payer: Self-pay | Admitting: Oncology

## 2013-11-19 VITALS — BP 140/76 | HR 82 | Temp 98.0°F | Resp 16 | Wt 171.3 lb

## 2013-11-19 DIAGNOSIS — N183 Chronic kidney disease, stage 3 unspecified: Secondary | ICD-10-CM

## 2013-11-19 DIAGNOSIS — R630 Anorexia: Secondary | ICD-10-CM

## 2013-11-19 DIAGNOSIS — C801 Malignant (primary) neoplasm, unspecified: Secondary | ICD-10-CM

## 2013-11-19 DIAGNOSIS — C241 Malignant neoplasm of ampulla of Vater: Secondary | ICD-10-CM

## 2013-11-19 DIAGNOSIS — K831 Obstruction of bile duct: Secondary | ICD-10-CM

## 2013-11-19 DIAGNOSIS — I959 Hypotension, unspecified: Secondary | ICD-10-CM

## 2013-11-19 DIAGNOSIS — C170 Malignant neoplasm of duodenum: Secondary | ICD-10-CM

## 2013-11-19 DIAGNOSIS — R112 Nausea with vomiting, unspecified: Secondary | ICD-10-CM

## 2013-11-19 DIAGNOSIS — G4700 Insomnia, unspecified: Secondary | ICD-10-CM

## 2013-11-19 MED ORDER — METOCLOPRAMIDE HCL 10 MG PO TABS: 10.0000 mg | ORAL_TABLET | Freq: Three times a day (TID) | ORAL | Status: AC

## 2013-11-19 MED ORDER — OXYCODONE HCL 5 MG PO TABS: 5.0000 mg | ORAL_TABLET | ORAL | Status: DC | PRN

## 2013-11-19 MED ORDER — TEMAZEPAM 15 MG PO CAPS: 15.0000 mg | ORAL_CAPSULE | Freq: Every evening | ORAL | Status: DC | PRN

## 2013-11-19 MED ORDER — DRONABINOL 2.5 MG PO CAPS: 2.5000 mg | ORAL_CAPSULE | Freq: Two times a day (BID) | ORAL | Status: DC

## 2013-11-19 NOTE — Patient Instructions (Signed)
[facility] Discharge Instructions  RECOMMENDATIONS MADE BY THE CONSULTANT AND ANY TEST RESULTS WILL BE SENT TO YOUR REFERRING PHYSICIAN.  EXAM FINDINGS BY THE PHYSICIAN TODAY AND SIGNS OR SYMPTOMS TO REPORT TO CLINIC OR PRIMARY PHYSICIAN: Exam and findings as discussed by [name]  PA-C.  Report uncontrolled pain, increased nausea or vomiting or other problems.  Let us know what you want to do as far a chemotherapy.  MEDICATIONS PRESCRIBED:  Metoclopropamide (Reglan) 10 mg take 4 times daily before meals and at bedtime Marinol 2.5mg  take twice daily before a meal (for nausea and appetite) Restoril 15 mg take 1 or 2 at bedtime as needed for sleep Oxycodone refill.  INSTRUCTIONS/FOLLOW-UP: Follow-up in 2 weeks  Thank you for choosing [facility] to provide your oncology and hematology care.  To afford each patient quality time with our providers, please arrive at least 15 minutes before your scheduled appointment time.  With your help, our goal is to use those 15 minutes to complete the necessary work-up to ensure our physicians have the information they need to help with your evaluation and healthcare recommendations.    Effective January 1st, 2014, we ask that you re-schedule your appointment with our physicians should you arrive 10 or more minutes late for your appointment.  We strive to give you quality time with our providers, and arriving late affects you and other patients whose appointments are after yours.    Again, thank you for choosing [facility].  Our hope is that these requests will decrease the amount of time that you wait before being seen by our physicians.       _____________________________________________________________  Should you have questions after your visit to [facility], please contact our office at [phone] between the hours of 8:30 a.m. and 5:00 p.m.  Voicemails left after 4:30 p.m. will not  be returned until the following business day.  For prescription refill requests, have your pharmacy contact our office with your prescription refill request.

## 2013-11-19 NOTE — Progress Notes (Signed)
No PCP Per Patient [address] [place] [address]  Duodenal cancer - Plan: oxyCODONE (OXY IR/ROXICODONE) 5 MG immediate release tablet  Biliary obstruction due to malignant neoplasm - Plan: oxyCODONE (OXY IR/ROXICODONE) 5 MG immediate release tablet  Nausea with vomiting - Plan: dronabinol (MARINOL) 2.5 MG capsule, metoCLOPramide (REGLAN) 10 MG tablet  Decreased appetite - Plan: dronabinol (MARINOL) 2.5 MG capsule  Insomnia - Plan: temazepam (RESTORIL) 15 MG capsule  CURRENT THERAPY: Chemotherapy on hold, at the patient's request.  S/P Cycle 1 of Oxaliplatin and Xeloda 1000 mg BID 7 days on and 7 days off   INTERVAL HISTORY: Trevor Crane 73 y.o. male returns for  regular  visit for followup of Stage IV Ampulla of Vater Adenocarcinoma, S/P 1 cycles of chemotherapy with a curative intent hopefully making him an operable candidate.    Duodenal cancer   09/17/2013 Initial Diagnosis Invasive adenocarcinoma, S/P biopsy of ampulla of vater by Dr. Laural Golden.   10/11/2013 -  Chemotherapy Oxaliplatin and Xeloda (1000 mg BID 7 days on and 7 days off)   10/25/2013 - 10/27/2013 Hospital Admission Poor oral intake and failure to thrive.  Improved during hospitalization and discharged   Trevor Crane is status post interventional radiology catheter tube change on 11/03/2013. He tolerated the procedure well with no residual complaints.  Today, Trevor Crane looks much improved compared to her last encounter. He is seen walking without a wheelchair. Strength is back to baseline with a firm handshake this morning. His wife accompanies him.  We spent some time discussing his complaints: 1. Decreased appetite 2. Nausea with vomiting 3. Insomnia  We discussed decreased appetite and nausea and vomiting together. I've encouraged him to utilize his Reglan and I bring him a new prescription for this medication. He'll take as scheduled to help with gastric motility. I provided education regarding this  medication. Additionally, I will prescribe him Marinol at a low dose of 2.5 mg twice a day. He denies any history of marijuana abuse in the past. I would not hesitate to increase his Marinol dose to 5 mg twice a day in the future depending on his tolerance and its effectiveness. I provided education regarding this medication as well. We discussed the risks, benefits, alternatives, and side effects of Marinol therapy. He is educated that this medication typically helped with nausea and vomiting and with a decreased appetite.  His insomnia continues to be an issue. He denies Ativan helping with his sleep. In this gentleman, it is certainly reasonable to help him with sleep and therefore I will prescribe him Restoril 15-30 mg at hour of sleep. I discussed the risks, benefits, alternatives, and side effects of this medication. He was encouraged to start with one capsule at bedtime and increase to 2 capsules at bedtime if the prior dose is not sufficient enough to help him sleep. He is encouraged not to increase past 30 mg without contacting our clinic to update [place]. I would not hesitate to increase his Restoril dose above 30 mg if necessary or added additional sleeping aid to his regimen.  He reports if I could fix the aforementioned 3 complaints he be a very happy man.  We discussed his chemotherapy and treatment options. His options are as follows: 1. Chemotherapy with a curative intent to hopefully make him an operable candidate. This would require full dose chemotherapy take advantage of this opportunity to make an operable. 2. We can treat his doses of chemotherapy and change her mind sent  to a palliative intent. He is educated regarding palliative chemotherapy and its role in prolonging life. This would not be with curative intent it would not make him an operable candidate. Cure would likely be out of the question. 3. Comfort Care with the help of hospice.  He's educated regarding all 3 options. He  understands he'll support any decision he makes and neither of the 3 decisions are right or wrong. You have to make decisions best for him. Since he feels so good today I question whether a part of his quality of life improvement presently is secondary to response to his first dose of chemotherapy I have educated regarding this possibility and encouraged him to reconsider chemotherapy with a curative intent. He will consider his 3 options and inform us in the near future regarding which he would like to choose.  He otherwise denies any complaints and ROS questioning is negative.    Past Medical History  Diagnosis Date  . Hypertension   . Hypercholesterolemia   . Panic attacks   . 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 Duodenal cancer   . Oral candidiasis   . Hiccups   . Severe malnutrition 10/27/2013    has Biliary obstruction due to malignant neoplasm; ARF (acute renal failure); Hydronephrosis of right kidney; Cholestatic jaundice; Duodenal cancer; UTI (lower urinary tract infection); Volume depletion; Renal mass; Oral candidiasis; Hypercholesterolemia; Panic attacks; Severe dehydration; Metabolic acidosis; and Severe malnutrition on his problem list.     is allergic to sulfa antibiotics.  Trevor Crane had no medications administered during this visit.  Past Surgical History  Procedure Laterality Date  . Esophagogastroduodenoscopy (egd) with propofol N/A 09/17/2013    Procedure: ESOPHAGOGASTRODUODENOSCOPY (EGD) WITH PROPOFOL;  Surgeon: Rogene [place], MD;  Location: [facility];  Service: Gastroenterology;  Laterality: N/A;  . Ercp N/A 09/17/2013    Procedure: ATTEMPTED ENDOSCOPIC RETROGRADE CHOLANGIOPANCREATOGRAPHY (ERCP);  Surgeon: Rogene Houston, MD;  Location: [facility];  Service: Gastroenterology;  Laterality: N/A;  . Esophageal biopsy N/A 09/17/2013    Procedure: BIOPSY;  Surgeon: Rogene Houston, MD;  Location: [facility];  Service: Gastroenterology;  Laterality: N/A;  . Ir catheter  tube change  11/03/13    Denies any headaches, dizziness, double vision, fevers, chills, night sweats, nausea, vomiting, diarrhea, constipation, chest pain, heart palpitations, shortness of breath, blood in stool, black tarry stool, urinary pain, urinary burning, urinary frequency, hematuria.   PHYSICAL EXAMINATION  ECOG PERFORMANCE STATUS: 2 - Symptomatic, <50% confined to bed  Filed Vitals:   11/19/13 1110  BP: 140/76  Pulse: 82  Temp: 98 F (36.7 C)  Resp: 16    GENERAL:alert, no distress, well nourished, well developed, comfortable, cooperative and smiling SKIN: skin color, texture, turgor are normal, no rashes or significant lesions HEAD: Normocephalic, No masses, lesions, tenderness or abnormalities EYES: normal, PERRLA, EOMI, Conjunctiva are pink and non-injected EARS: External ears normal OROPHARYNX:mucous membranes are moist  NECK: supple, trachea midline LYMPH:  not examined BREAST:not examined LUNGS: not examined HEART: not examined ABDOMEN:not examined BACK: Back symmetric, no curvature. EXTREMITIES:less then 2 second capillary refill, no skin discoloration, no clubbing, no cyanosis  NEURO: alert & oriented x 3 with fluent speech, no focal motor/sensory deficits, gait normal   LABORATORY DATA: CBC    Component Value Date/Time   WBC 10.5 11/01/2013 1005   RBC 3.88* 11/01/2013 1005   RBC 3.83* 09/28/2013 1338   HGB 12.2* 11/01/2013 1005   HCT 36.6* 11/01/2013 1005  PLT 380 11/01/2013 1005   MCV 94.3 11/01/2013 1005   MCH 31.4 11/01/2013 1005   MCHC 33.3 11/01/2013 1005   RDW 16.5* 11/01/2013 1005   LYMPHSABS 2.7 11/01/2013 1005   MONOABS 1.2* 11/01/2013 1005   EOSABS 0.5 11/01/2013 1005   BASOSABS 0.1 11/01/2013 1005      Chemistry      Component Value Date/Time   NA 135* 11/01/2013 1005   K 4.3 11/01/2013 1005   CL 95* 11/01/2013 1005   CO2 27 11/01/2013 1005   BUN 18 11/01/2013 1005   CREATININE 1.97* 11/01/2013 1005      Component Value Date/Time   CALCIUM 8.8 11/01/2013 1005    ALKPHOS 154* 11/01/2013 1005   AST 22 11/01/2013 1005   ALT 16 11/01/2013 1005   BILITOT 0.6 11/01/2013 1005        ASSESSMENT:  1. 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 Chemotherapy on hold at present time at the patient's request.  2. Stage 3 Chronic Renal Disease  3. Hypotension, stable 4. Nausea with vomiting 5. Decreased appetite 6. Insomnia  Patient Active Problem List   Diagnosis Date Noted  . Severe malnutrition 10/27/2013  . Metabolic acidosis 99991111  . UTI (lower urinary tract infection) 10/25/2013  . Volume depletion 10/25/2013  . Renal mass 10/25/2013  . Oral candidiasis 10/25/2013  . Severe dehydration 10/25/2013  . Hypercholesterolemia   . Panic attacks   . Duodenal cancer 09/28/2013  . ARF (acute renal failure) 09/16/2013  . Hydronephrosis of right kidney 09/16/2013  . Cholestatic jaundice 09/16/2013  . Biliary obstruction due to malignant neoplasm 09/15/2013     PLAN:  1. I personally reviewed and went over laboratory results with the patient.  The results are noted within this dictation. 2. I personally reviewed and went over radiographic studies with the patient.  The results are noted within this dictation.   3. We discussed treatment options:  A. Curative intent  B. Palliative intent  C. Comfort care. 4. He will consider his options above.  5. Patient education regarding nausea and vomiting.  6. Reglan 10 mg TID before meals and at bedtime prescribed for nausea/vomiting, and to speed up gastric motility. 7. Patient education regarding appetite stimulation 8. Marinol 2.5 mg BID prescribed for nausea/vomiting and appetite stimulation 9. Risks, benefits, alternatives, and side effects of Marinol discussed.  10. Refill on Oxycodone 11. Patient education regarding insomnia 12. Restoril 15-30 mg at HS 13. Risks, benefits, alternatives, and side effects  of Restoril discussed 14. Rx for 10 cc syringes for catheter flushing. 15. Return in 2 weeks and evaluate effectiveness of aforementioned interventions and reconsider chemotherapeutic intervention.  He will call sooner with his choice of treatment modalities.    THERAPY PLAN:  Our plan was a curative intent modality of treatment with chemotherapy possibly affording him the opportunity to be an operable candidate.  However, he is reconsidering his options as outlined above.  Chemotherapy is on hold at the patient's request.  We will manage symptoms.   All questions were answered. The patient knows to call the clinic with any problems, questions or concerns. We can certainly see the patient much sooner if necessary.  Patient and plan discussed with Dr. Farrel Gobble and he is in agreement with the aforementioned.   Trevor Crane

## 2013-11-22 ENCOUNTER — Inpatient Hospital Stay (HOSPITAL_COMMUNITY): Payer: Medicare Other

## 2013-11-22 ENCOUNTER — Ambulatory Visit (HOSPITAL_COMMUNITY): Payer: Medicare Other

## 2013-11-28 ENCOUNTER — Emergency Department (HOSPITAL_COMMUNITY): Payer: Medicare Other

## 2013-11-28 ENCOUNTER — Encounter (HOSPITAL_COMMUNITY): Payer: Self-pay | Admitting: Emergency Medicine

## 2013-11-28 ENCOUNTER — Emergency Department (HOSPITAL_COMMUNITY)
Admission: EM | Admit: 2013-11-28 | Discharge: 2013-11-29 | Disposition: A | Discharge status: Home / Self Care | Payer: Medicare Other | Attending: Emergency Medicine | Admitting: Emergency Medicine

## 2013-11-28 DIAGNOSIS — R143 Flatulence: Secondary | ICD-10-CM

## 2013-11-28 DIAGNOSIS — Z79899 Other long term (current) drug therapy: Secondary | ICD-10-CM | POA: Insufficient documentation

## 2013-11-28 DIAGNOSIS — F410 Panic disorder [episodic paroxysmal anxiety] without agoraphobia: Secondary | ICD-10-CM | POA: Insufficient documentation

## 2013-11-28 DIAGNOSIS — R110 Nausea: Secondary | ICD-10-CM | POA: Insufficient documentation

## 2013-11-28 DIAGNOSIS — E7800 Pure hypercholesterolemia, unspecified: Secondary | ICD-10-CM | POA: Insufficient documentation

## 2013-11-28 DIAGNOSIS — R5381 Other malaise: Secondary | ICD-10-CM | POA: Insufficient documentation

## 2013-11-28 DIAGNOSIS — R630 Anorexia: Secondary | ICD-10-CM | POA: Insufficient documentation

## 2013-11-28 DIAGNOSIS — R1011 Right upper quadrant pain: Secondary | ICD-10-CM | POA: Insufficient documentation

## 2013-11-28 DIAGNOSIS — R142 Eructation: Secondary | ICD-10-CM | POA: Insufficient documentation

## 2013-11-28 DIAGNOSIS — Z8619 Personal history of other infectious and parasitic diseases: Secondary | ICD-10-CM | POA: Insufficient documentation

## 2013-11-28 DIAGNOSIS — Z85828 Personal history of other malignant neoplasm of skin: Secondary | ICD-10-CM | POA: Insufficient documentation

## 2013-11-28 DIAGNOSIS — R1013 Epigastric pain: Secondary | ICD-10-CM | POA: Insufficient documentation

## 2013-11-28 DIAGNOSIS — R109 Unspecified abdominal pain: Secondary | ICD-10-CM

## 2013-11-28 DIAGNOSIS — R5383 Other fatigue: Secondary | ICD-10-CM

## 2013-11-28 DIAGNOSIS — I10 Essential (primary) hypertension: Secondary | ICD-10-CM | POA: Insufficient documentation

## 2013-11-28 DIAGNOSIS — R1012 Left upper quadrant pain: Secondary | ICD-10-CM | POA: Insufficient documentation

## 2013-11-28 DIAGNOSIS — Z8509 Personal history of malignant neoplasm of other digestive organs: Secondary | ICD-10-CM | POA: Insufficient documentation

## 2013-11-28 DIAGNOSIS — R141 Gas pain: Secondary | ICD-10-CM | POA: Insufficient documentation

## 2013-11-28 LAB — CBC WITH DIFFERENTIAL/PLATELET
Basophils Absolute: 0 10*3/uL (ref 0.0–0.1)
Basophils Relative: 0 % (ref 0–1)
Eosinophils Absolute: 0 10*3/uL (ref 0.0–0.7)
Eosinophils Relative: 0 % (ref 0–5)
HEMATOCRIT: 43.2 % (ref 39.0–52.0)
HEMOGLOBIN: 14.2 g/dL (ref 13.0–17.0)
LYMPHS PCT: 7 % — AB (ref 12–46)
Lymphs Abs: 0.7 10*3/uL (ref 0.7–4.0)
MCH: 31.1 pg (ref 26.0–34.0)
MCHC: 32.9 g/dL (ref 30.0–36.0)
MCV: 94.7 fL (ref 78.0–100.0)
MONO ABS: 0.5 10*3/uL (ref 0.1–1.0)
MONOS PCT: 5 % (ref 3–12)
Neutro Abs: 9.1 10*3/uL — ABNORMAL HIGH (ref 1.7–7.7)
Neutrophils Relative %: 88 % — ABNORMAL HIGH (ref 43–77)
PLATELETS: 422 10*3/uL — AB (ref 150–400)
RBC: 4.56 MIL/uL (ref 4.22–5.81)
RDW: 14.7 % (ref 11.5–15.5)
WBC: 10.3 10*3/uL (ref 4.0–10.5)

## 2013-11-28 LAB — COMPREHENSIVE METABOLIC PANEL
ALT: 7 U/L (ref 0–53)
AST: 18 U/L (ref 0–37)
Albumin: 3.1 g/dL — ABNORMAL LOW (ref 3.5–5.2)
Alkaline Phosphatase: 125 U/L — ABNORMAL HIGH (ref 39–117)
BUN: 16 mg/dL (ref 6–23)
CALCIUM: 9.7 mg/dL (ref 8.4–10.5)
CO2: 26 mEq/L (ref 19–32)
Chloride: 93 mEq/L — ABNORMAL LOW (ref 96–112)
Creatinine, Ser: 1.4 mg/dL — ABNORMAL HIGH (ref 0.50–1.35)
GFR calc Af Amer: 57 mL/min — ABNORMAL LOW (ref >90)
GFR calc non Af Amer: 49 mL/min — ABNORMAL LOW (ref >90)
GLUCOSE: 137 mg/dL — AB (ref 70–99)
Potassium: 4.7 mEq/L (ref 3.7–5.3)
SODIUM: 134 meq/L — AB (ref 137–147)
Total Bilirubin: 0.4 mg/dL (ref 0.3–1.2)
Total Protein: 7.6 g/dL (ref 6.0–8.3)

## 2013-11-28 LAB — LIPASE, BLOOD: Lipase: 38 U/L (ref 11–59)

## 2013-11-28 MED ORDER — SODIUM CHLORIDE 0.9 % IV SOLN
1000.0000 mL | Freq: Once | INTRAVENOUS | Status: AC
  Administered 2013-11-28: 1000 mL via INTRAVENOUS

## 2013-11-28 MED ORDER — HYDROMORPHONE HCL 2 MG PO TABS: 2.0000 mg | ORAL_TABLET | Freq: Four times a day (QID) | ORAL | Status: AC | PRN

## 2013-11-28 MED ORDER — SODIUM CHLORIDE 0.9 % IV SOLN: 1000.0000 mL | INTRAVENOUS | Status: DC

## 2013-11-28 MED ORDER — HYDROMORPHONE HCL 2 MG PO TABS
2.0000 mg | ORAL_TABLET | Freq: Once | ORAL | Status: AC
  Administered 2013-11-28: 2 mg via ORAL
  Filled 2013-11-28: qty 1

## 2013-11-28 MED ORDER — HYDROMORPHONE HCL PF 1 MG/ML IJ SOLN
1.0000 mg | Freq: Once | INTRAMUSCULAR | Status: AC
Start: 2013-11-28 — End: 2013-11-28
  Administered 2013-11-28: 1 mg via INTRAVENOUS
  Filled 2013-11-28: qty 1

## 2013-11-28 MED ORDER — ONDANSETRON HCL 4 MG/2ML IJ SOLN
4.0000 mg | Freq: Once | INTRAMUSCULAR | Status: AC
  Administered 2013-11-28: 4 mg via INTRAMUSCULAR
  Filled 2013-11-28: qty 2

## 2013-11-28 NOTE — ED Provider Notes (Signed)
CSN: [id number]     Arrival date & time 11/28/13  2005 History   First MD Initiated Contact with Patient 11/28/13 2137     Chief Complaint  Patient presents with  . Abdominal Pain     (Consider location/radiation/quality/duration/timing/severity/associated sxs/prior Treatment) HPI Comments: Patient is 72 year old male with PMHx significant for HTN, and Stage IV adenocarcinoma to the duodenum who had previously been on chemotherapy and is followed in [place] for his cancer.  He reports that his pain is usually well controlled on the medication he has been given in the past but today the pain worsened.  He states that the pain is waxing and waning, runs across the upper portion of his abdomen, reports nausea but no vomiting.  He states pain has been eased after having a bowel movement.  Denies blood in the bowel movement.  Rates pain 6/10 at this time and would like to be discharged to home.  Patient is a 73 y.o. male presenting with abdominal pain. The history is provided by the patient and the spouse. No language interpreter was used.  Abdominal Pain Pain location:  LUQ, RUQ and epigastric Pain quality: bloating, sharp and shooting   Pain radiates to:  Does not radiate Pain severity:  Severe Onset quality:  Gradual Duration:  8 hours Timing:  Constant Progression:  Worsening Chronicity:  Chronic Relieved by:  Nothing Worsened by:  Nothing tried Associated symptoms: anorexia, belching, fatigue and nausea   Associated symptoms: no chest pain, no chills, no constipation, no cough, no diarrhea, no dysuria, no fever, no flatus, no hematuria, no melena, no shortness of breath, no sore throat and no vomiting     Past Medical History  Diagnosis Date  . Hypertension   . Hypercholesterolemia   . Panic attacks   . 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 Duodenal cancer   . Oral candidiasis   . Hiccups   . Severe malnutrition 10/27/2013   Past Surgical History  Procedure Laterality  Date  . Esophagogastroduodenoscopy (egd) with propofol N/A 09/17/2013    Procedure: ESOPHAGOGASTRODUODENOSCOPY (EGD) WITH PROPOFOL;  Surgeon: Rogene Houston, MD;  Location: [facility];  Service: Gastroenterology;  Laterality: N/A;  . Ercp N/A 09/17/2013    Procedure: ATTEMPTED ENDOSCOPIC RETROGRADE CHOLANGIOPANCREATOGRAPHY (ERCP);  Surgeon: Rogene Houston, MD;  Location: [facility];  Service: Gastroenterology;  Laterality: N/A;  . Esophageal biopsy N/A 09/17/2013    Procedure: BIOPSY;  Surgeon: Rogene Houston, MD;  Location: [facility];  Service: Gastroenterology;  Laterality: N/A;  . Ir catheter tube change  11/03/13   Family History  Problem Relation Age of Onset  . Cancer Mother     colon cancer  . Hypertension Father   . Hypertension Maternal Uncle    History  Substance Use Topics  . Smoking status: Never Smoker   . Smokeless tobacco: Former Systems developer  . Alcohol Use: 0.6 oz/week    1 Cans of beer per week     Comment: reports drank moderately until Oct 15th, none since then.    Review of Systems  Constitutional: Positive for fatigue. Negative for fever and chills.  HENT: Negative for sore throat.   Respiratory: Negative for cough and shortness of breath.   Cardiovascular: Negative for chest pain.  Gastrointestinal: Positive for nausea, abdominal pain and anorexia. Negative for vomiting, diarrhea, constipation, melena and flatus.  Genitourinary: Negative for dysuria and hematuria.  All other systems reviewed and are negative.  Allergies  Sulfa antibiotics  Home Medications   Current Outpatient Rx  Name  Route  Sig  Dispense  Refill  . calcium carbonate (TUMS - DOSED IN MG ELEMENTAL CALCIUM) 500 MG chewable tablet   Oral   Chew 1 tablet by mouth 3 (three) times daily as needed for indigestion or heartburn.          . diphenoxylate-atropine (LOMOTIL) 2.5-0.025 MG per tablet   Oral   Take 1 tablet by mouth 4 (four) times daily as needed for diarrhea or loose stools.          Marland Kitchen dronabinol (MARINOL) 2.5 MG capsule   Oral   Take 1 capsule (2.5 mg total) by mouth 2 (two) times daily before a meal.   60 capsule   1   . FLUoxetine (PROZAC) 20 MG capsule   Oral   Take 20 mg by mouth every morning.          . fluticasone (FLONASE) 50 MCG/ACT nasal spray   Each Nare   Place 1 spray into both nostrils daily as needed for allergies or rhinitis.         Marland Kitchen lidocaine-prilocaine (EMLA) cream   Topical   Apply 1 application topically daily as needed (For port-a-cath.).         . Loratadine-Pseudoephedrine (CLARITIN-D 24 HOUR PO)   Oral   Take 0.5 tablets by mouth every morning.          Marland Kitchen LORazepam (ATIVAN) 1 MG tablet   Oral   Take 1 tablet (1 mg total) by mouth 3 (three) times daily as needed for anxiety.   60 tablet   2   . methylcellulose (ARTIFICIAL TEARS) 1 % ophthalmic solution   Both Eyes   Place 1 drop into both eyes 2 (two) times daily as needed (dry eyes).         . metoCLOPramide (REGLAN) 10 MG tablet   Oral   Take 1 tablet (10 mg total) by mouth 4 (four) times daily -  before meals and at bedtime.   120 tablet   2   . omeprazole (PRILOSEC) 20 MG capsule   Oral   Take 20 mg by mouth every morning.          . ondansetron (ZOFRAN) 4 MG tablet   Oral   Take 4 mg by mouth every 8 (eight) hours as needed for nausea or vomiting.         Marland Kitchen oxyCODONE (OXY IR/ROXICODONE) 5 MG immediate release tablet   Oral   Take 1 tablet (5 mg total) by mouth every 4 (four) hours as needed for moderate pain or severe pain.   100 tablet   0   . simvastatin (ZOCOR) 40 MG tablet   Oral   Take 40 mg by mouth at bedtime.         . temazepam (RESTORIL) 15 MG capsule   Oral   Take 1-2 capsules (15-30 mg total) by mouth at bedtime as needed for sleep.   60 capsule   0   . acetaminophen (TYLENOL) 325 MG tablet   Oral   Take 325 mg by mouth every 6 (six) hours as needed for mild pain or moderate pain.         Marland Kitchen prochlorperazine (COMPAZINE)  10 MG tablet   Oral   Take 10 mg by mouth every 6 (six) hours as needed for nausea or vomiting.          BP  148/81  Pulse 87  Temp(Src) 97.3 F (36.3 C) (Oral)  Resp 20  Ht 5\' 11"  (1.803 m)  Wt 170 lb (77.111 kg)  BMI 23.72 kg/m2  SpO2 97% Physical Exam  Nursing note and vitals reviewed. Constitutional: He is oriented to person, place, and time. He appears well-developed and well-nourished. No distress.  HENT:  Head: Normocephalic and atraumatic.  Right Ear: External ear normal.  Left Ear: External ear normal.  Nose: Nose normal.  Mouth/Throat: Oropharynx is clear and moist. No oropharyngeal exudate.  Eyes: Conjunctivae are normal. Pupils are equal, round, and reactive to light. No scleral icterus.  Neck: Normal range of motion. Neck supple.  Cardiovascular: Normal rate, regular rhythm and normal heart sounds.  Exam reveals no gallop and no friction rub.   No murmur heard. Pulmonary/Chest: Effort normal and breath sounds normal. No respiratory distress. He has no wheezes. He has no rales. He exhibits no tenderness.  Abdominal: Soft. Bowel sounds are normal. He exhibits no distension and no mass. There is tenderness in the right upper quadrant, epigastric area and left upper quadrant. There is no rebound, no guarding and no CVA tenderness. No hernia.    Musculoskeletal: Normal range of motion. He exhibits no edema and no tenderness.  Lymphadenopathy:    He has no cervical adenopathy.  Neurological: He is alert and oriented to person, place, and time. He exhibits normal muscle tone. Coordination normal.  Skin: Skin is warm and dry. No rash noted. No erythema. No pallor.  Psychiatric: He has a normal mood and affect. His behavior is normal. Judgment and thought content normal.    ED Course  Procedures (including critical care time) Labs Review Labs Reviewed  CBC WITH DIFFERENTIAL - Abnormal; Notable for the following:    Platelets 422 (*)    Neutrophils Relative % 88 (*)      Neutro Abs 9.1 (*)    Lymphocytes Relative 7 (*)    All other components within normal limits  COMPREHENSIVE METABOLIC PANEL - Abnormal; Notable for the following:    Sodium 134 (*)    Chloride 93 (*)    Glucose, Bld 137 (*)    Creatinine, Ser 1.40 (*)    Albumin 3.1 (*)    Alkaline Phosphatase 125 (*)    GFR calc non Af Amer 49 (*)    GFR calc Af Amer 57 (*)    All other components within normal limits  LIPASE, BLOOD   Imaging Review Dg Abd Acute W/chest  11/28/2013   CLINICAL DATA:  Biliary obstruction, malignancy, jaundice, hydronephrosis now with abdominal pain.  EXAM: ACUTE ABDOMEN SERIES (ABDOMEN 2 VIEW & CHEST 1 VIEW)  COMPARISON:  SP BILIARY STENT dated 11/03/2013; US RENAL dated 10/26/2013; DG CHEST 2 VIEW dated 10/25/2013  FINDINGS: Cardiomediastinal silhouette is unremarkable for this low inspiratory portable examination with crowded vasculature markings. No pleural effusions or focal consolidations. Strandy right perihilar densities. Mildly elevated right hemidiaphragm. Single lumen right chest Port-A-Cath in place with distal tip projecting in proximal superior vena cava. No pneumothorax. Soft tissue planes and included osseous structures are nonsuspicious. Mild degenerative change of the thoracic spine.  Nonspecific air-fluid level in the right upper quadrant. Overall paucity of small bowel gas with mild amount of retained large bowel stool. A right double J nephro ureteral stent in place. In addition, a right upper quadrant pigtail drainage catheter in place, along the course of biliary duct with distal retaining looped in the region of the second versus third portion  the duodenum. No intraperitoneal free air. Severe lower lumbar facet arthropathy. Soft tissue planes are nonsuspicious.  IMPRESSION: No acute cardiopulmonary process.  Right nephroureteral stent in place.  Biliary drain in place.  Nonspecific air-fluid level in right upper quadrant with overall paucity of small bowel gas.    Electronically Signed   By: Elon Alas   On: 11/28/2013 23:15     EKG Interpretation None      MDM   Abdominal pain  Patient is 73 year old male with biliary obstruction and malignant Stage IV duodenal cancer who presents with worsening of his pain.  He is able to eat and drink and reports nausea minimal, his labs are stable, he does not appear to be dehydrated, reports good pain relief with the dilaudid and there is no evidence of SBO on plain x-ray.  I have discussed this patient with Dr. Lacinda Axon and we feel that the patient is stable to return home.  I have given him oral dilaudid to help with pain control when the oxy IR does not work.  He will plan on following up with Mr. Sheldon Silvan this week if possible.  He is instructed to return to the ED if he is unable to keep food or medications down, his pain worsens to the point that medication is not helping.    Idalia Needle Joelyn Oms, PA-C 11/29/13 0007

## 2013-11-28 NOTE — ED Notes (Signed)
C/o abd pain since 1500 today, c/o gas, denies v/d, mild nausea earlier

## 2013-11-28 NOTE — Discharge Instructions (Signed)
Abdominal Pain, Adult °Many things can cause abdominal pain. Usually, abdominal pain is not caused by a disease and will improve without treatment. It can often be observed and treated at home. Your health care provider will do a physical exam and possibly order blood tests and X-rays to help determine the seriousness of your pain. However, in many cases, more time must pass before a clear cause of the pain can be found. Before that point, your health care provider may not know if you need more testing or further treatment. °HOME CARE INSTRUCTIONS  °Monitor your abdominal pain for any changes. The following actions may help to alleviate any discomfort you are experiencing: °· Only take over-the-counter or prescription medicines as directed by your health care provider. °· Do not take laxatives unless directed to do so by your health care provider. °· Try a clear liquid diet (broth, tea, or water) as directed by your health care provider. Slowly move to a bland diet as tolerated. °SEEK MEDICAL CARE IF: °· You have unexplained abdominal pain. °· You have abdominal pain associated with nausea or diarrhea. °· You have pain when you urinate or have a bowel movement. °· You experience abdominal pain that wakes you in the night. °· You have abdominal pain that is worsened or improved by eating food. °· You have abdominal pain that is worsened with eating fatty foods. °SEEK IMMEDIATE MEDICAL CARE IF:  °· Your pain does not go away within 2 hours. °· You have a fever. °· You keep throwing up (vomiting). °· Your pain is felt only in portions of the abdomen, such as the right side or the left lower portion of the abdomen. °· You pass bloody or black tarry stools. °MAKE SURE YOU: °· Understand these instructions.   °· Will watch your condition.   °· Will get help right away if you are not doing well or get worse.   °Document Released: 06/26/2005 Document Revised: 07/07/2013 Document Reviewed: 05/26/2013 °Exit[employer]® Patient  Information ©2014 [employer]. ° °

## 2013-12-01 ENCOUNTER — Other Ambulatory Visit (HOSPITAL_COMMUNITY): Payer: Self-pay | Admitting: Interventional Radiology

## 2013-12-01 ENCOUNTER — Other Ambulatory Visit (HOSPITAL_COMMUNITY): Payer: Self-pay | Admitting: Oncology

## 2013-12-01 ENCOUNTER — Ambulatory Visit (HOSPITAL_COMMUNITY)
Admission: RE | Admit: 2013-12-01 | Discharge: 2013-12-01 | Disposition: A | Discharge status: Home / Self Care | Payer: Medicare Other | Source: Ambulatory Visit | Attending: Oncology | Admitting: Oncology

## 2013-12-01 DIAGNOSIS — K831 Obstruction of bile duct: Secondary | ICD-10-CM | POA: Insufficient documentation

## 2013-12-01 DIAGNOSIS — C241 Malignant neoplasm of ampulla of Vater: Secondary | ICD-10-CM | POA: Insufficient documentation

## 2013-12-01 DIAGNOSIS — C801 Malignant (primary) neoplasm, unspecified: Secondary | ICD-10-CM

## 2013-12-01 DIAGNOSIS — Z4682 Encounter for fitting and adjustment of non-vascular catheter: Secondary | ICD-10-CM | POA: Insufficient documentation

## 2013-12-01 MED ORDER — LIDOCAINE HCL 1 % IJ SOLN
INTRAMUSCULAR | Status: AC
  Filled 2013-12-01: qty 20

## 2013-12-01 MED ORDER — IOHEXOL 300 MG/ML  SOLN
10.0000 mL | Freq: Once | INTRAMUSCULAR | Status: AC | PRN
  Administered 2013-12-01: 10 mL

## 2013-12-01 MED ORDER — CEFTRIAXONE SODIUM 1 G IJ SOLR
1.0000 g | Freq: Once | INTRAMUSCULAR | Status: AC
  Administered 2013-12-01: 1 g via INTRAMUSCULAR
  Filled 2013-12-01: qty 10

## 2013-12-01 NOTE — Progress Notes (Signed)
-  Rescheduled-  KEFALAS,THOMAS  

## 2013-12-01 NOTE — ED Provider Notes (Signed)
Medical screening examination/treatment/procedure(s) were conducted as a shared visit with non-physician practitioner(s) and myself.  I personally evaluated the patient during the encounter.   EKG Interpretation None     No acute abdomen. No signs of small bowel obstruction. Patient desires to go home.  Nat Christen, MD 12/01/13 [phone]

## 2013-12-01 NOTE — Procedures (Signed)
Successful RT 10FR INT EXT BILIARY TUBE EXCHG NO COMP STABLE FULL REPORT IN PACS

## 2013-12-02 ENCOUNTER — Ambulatory Visit (HOSPITAL_COMMUNITY): Payer: Medicare Other | Admitting: Oncology

## 2013-12-13 NOTE — Progress Notes (Signed)
No PCP Per Patient No address on file  Insomnia - Plan: temazepam (RESTORIL) 30 MG capsule  Duodenal cancer - Plan: oxyCODONE (OXY IR/ROXICODONE) 5 MG immediate release tablet  Biliary obstruction due to malignant neoplasm - Plan: oxyCODONE (OXY IR/ROXICODONE) 5 MG immediate release tablet  CURRENT THERAPY: Transition to comfort care  INTERVAL HISTORY: Trevor Crane 73 y.o. male returns for  regular  visit for followup of Stage IV Ampulla of Vater Adenocarcinoma, S/P 1 cycles of chemotherapy with a curative intent hopefully making him an operable candidate.  S/P Cycle 1 of Oxaliplatin and Xeloda 1000 mg BID 7 days on and 7 days off.  Transition to comfort care per conversation today (12/15/2013)    Duodenal cancer   09/17/2013 Initial Diagnosis Invasive adenocarcinoma, S/P biopsy of ampulla of vater by Dr. Laural Golden.   10/11/2013 -  Chemotherapy Oxaliplatin and Xeloda (1000 mg BID 7 days on and 7 days off)   10/25/2013 - 10/27/2013 Hospital Admission Poor oral intake and failure to thrive.  Improved during hospitalization and discharged   Trevor Crane is status post interventional radiology catheter tube change on 11/03/2013. He tolerated the procedure well with no residual complaints.  I personally reviewed and went over radiographic studies with the patient.  The results are noted within this dictation.    Chemotherapy is on hold at the patient's request as he decides on his treatment options.  1. Chemotherapy with a curative intent to hopefully make him an operable candidate. This would require full dose chemotherapy take advantage of this opportunity to make an operable.  2. We can treat his doses of chemotherapy and change her mind sent to a palliative intent. He is educated regarding palliative chemotherapy and its role in prolonging life. This would not be with curative intent it would not make him an operable candidate. Cure would likely be out of the question.  3. Comfort Care with the  help of hospice.  At this time, Trevor Crane would like to transition to [place].  He wishes to consider his option of Hospice at home.  We had a long conversation regarding Hospice vs Chemotherapy.  The patient is aware that chemotherapy may adversely affect quality of life and may not prove to be beneficial.  Patient education was given regarding Hospice and the services they provide.  The patient understands that studies report that early enrollment in Hospice actually allows the patient to live longer compared to those who enroll in Hospice nearer to end of life. Hospice will allow the patient to stay at home at end of life or go to a facility for end of life care.  At this point, the patient would like to remain at home.  Hospice provides the patient with a team of providers to help with care including physicians, nurses, aids, chaplains, and social workers.  Hospice's goal is to keep patients out of the hospital and and comfortable by controlling symptoms with medications.  The patient is certainly Hospice appropriate with a life expectancy of less than 6 months.  The patient has declined active therapy at this time.  The patient understands that he can be discharged from Hospice at anytime.    He was given Marinol for appetite stimulation on our last encounter and this was ineffective so he stopped the medication.  I would have rather increased the dose instead of stopping the medication if he was tolerating the medication, but I did not push the issue.   He reports that 30 mg of  Restoril is not effective for him to help with sleep.  He denies any drowsiness in the AM with 30 mg of Restoril.  He asks for a more potent dose.  I will prescribe 30 mg of Restoril 1-2 tabs at HS.  If ineffective,. He is encouraged to contact our clinic.  At the conclusion of our conversation, I believe the patient will be going with Hospice in the very near future which is appropriate.  He would like to have the weekend to  consider this option.  He has heard good things about [facility], which is who we would refer him to.   Past Medical History  Diagnosis Date  . Hypertension   . Hypercholesterolemia   . Panic attacks   . 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 Duodenal cancer   . Oral candidiasis   . Hiccups   . Severe malnutrition 10/27/2013    has Biliary obstruction due to malignant neoplasm; Hydronephrosis of right kidney; Cholestatic jaundice; Duodenal cancer; Renal mass; Hypercholesterolemia; Panic attacks; Severe dehydration; and Severe malnutrition on his problem list.     is allergic to sulfa antibiotics.  Trevor Crane had no medications administered during this visit.  Past Surgical History  Procedure Laterality Date  . Esophagogastroduodenoscopy (egd) with propofol N/A 09/17/2013    Procedure: ESOPHAGOGASTRODUODENOSCOPY (EGD) WITH PROPOFOL;  Surgeon: Rogene Houston, MD;  Location: [facility];  Service: Gastroenterology;  Laterality: N/A;  . Ercp N/A 09/17/2013    Procedure: ATTEMPTED ENDOSCOPIC RETROGRADE CHOLANGIOPANCREATOGRAPHY (ERCP);  Surgeon: Rogene Houston, MD;  Location: [facility];  Service: Gastroenterology;  Laterality: N/A;  . Esophageal biopsy N/A 09/17/2013    Procedure: BIOPSY;  Surgeon: Rogene Houston, MD;  Location: [facility];  Service: Gastroenterology;  Laterality: N/A;  . Ir catheter tube change  11/03/13    Denies any headaches, dizziness, double vision, fevers, chills, night sweats, nausea, vomiting, diarrhea, constipation, chest pain, heart palpitations, shortness of breath, blood in stool, black tarry stool, urinary pain, urinary burning, urinary frequency, hematuria.   PHYSICAL EXAMINATION  ECOG PERFORMANCE STATUS: 3 - Symptomatic, >50% confined to bed  Filed Vitals:   12/15/13 1100  BP: 113/82  Pulse: 102  Temp: 97.3 F (36.3 C)  Resp: 20    GENERAL:alert, no distress, cachectic and smiling SKIN: skin color, texture, turgor are normal, no rashes  or significant lesions HEAD: Normocephalic, No masses, lesions, tenderness or abnormalities EYES: normal, PERRLA, EOMI, Conjunctiva are pink and non-injected EARS: External ears normal OROPHARYNX:mucous membranes are moist  NECK: trachea midline LYMPH:  not examined BREAST:not examined LUNGS: not examined HEART: not examined ABDOMEN:not examined BACK: not examined EXTREMITIES:less then 2 second capillary refill, no skin discoloration  NEURO: alert & oriented x 3 with fluent speech   LABORATORY DATA: CBC    Component Value Date/Time   WBC 10.3 11/28/2013 2220   RBC 4.56 11/28/2013 2220   RBC 3.83* 09/28/2013 1338   HGB 14.2 11/28/2013 2220   HCT 43.2 11/28/2013 2220   PLT 422* 11/28/2013 2220   MCV 94.7 11/28/2013 2220   MCH 31.1 11/28/2013 2220   MCHC 32.9 11/28/2013 2220   RDW 14.7 11/28/2013 2220   LYMPHSABS 0.7 11/28/2013 2220   MONOABS 0.5 11/28/2013 2220   EOSABS 0.0 11/28/2013 2220   BASOSABS 0.0 11/28/2013 2220      Chemistry      Component Value Date/Time   NA 134* 11/28/2013 2220   K 4.7 11/28/2013 2220  CL 93* 11/28/2013 2220   CO2 26 11/28/2013 2220   BUN 16 11/28/2013 2220   CREATININE 1.40* 11/28/2013 2220      Component Value Date/Time   CALCIUM 9.7 11/28/2013 2220   ALKPHOS 125* 11/28/2013 2220   AST 18 11/28/2013 2220   ALT 7 11/28/2013 2220   BILITOT 0.4 11/28/2013 2220       RADIOGRAPHIC STUDIES:  11/28/2013  CLINICAL DATA: Biliary obstruction, malignancy, jaundice,  hydronephrosis now with abdominal pain.  EXAM:  ACUTE ABDOMEN SERIES (ABDOMEN 2 VIEW & CHEST 1 VIEW)  COMPARISON: SP BILIARY STENT dated 11/03/2013; US RENAL dated  10/26/2013; DG CHEST 2 VIEW dated 10/25/2013  FINDINGS:  Cardiomediastinal silhouette is unremarkable for this low  inspiratory portable examination with crowded vasculature markings.  No pleural effusions or focal consolidations. Strandy right  perihilar densities. Mildly elevated right hemidiaphragm. Single  lumen right chest Port-A-Cath in place with  distal tip projecting in  proximal superior vena cava. No pneumothorax. Soft tissue planes and  included osseous structures are nonsuspicious. Mild degenerative  change of the thoracic spine.  Nonspecific air-fluid level in the right upper quadrant. Overall  paucity of small bowel gas with mild amount of retained large bowel  stool. A right double J nephro ureteral stent in place. In addition,  a right upper quadrant pigtail drainage catheter in place, along the  course of biliary duct with distal retaining looped in the region of  the second versus third portion the duodenum. No intraperitoneal  free air. Severe lower lumbar facet arthropathy. Soft tissue planes  are nonsuspicious.  IMPRESSION:  No acute cardiopulmonary process.  Right nephroureteral stent in place. Biliary drain in place.  Nonspecific air-fluid level in right upper quadrant with overall  paucity of small bowel gas.  Electronically Signed  By: Elon Alas  On: 11/28/2013 23:15    ASSESSMENT:  1. 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 Transition to comfort care per conversation today (12/15/2013) 2. Stage 3 Chronic Renal Disease  3. Hypotension, stable  5. Decreased appetite, declines further intervention 6. Insomnia, I will increase Restoril dose.  Patient Active Problem List   Diagnosis Date Noted  . Severe malnutrition 10/27/2013  . Renal mass 10/25/2013  . Severe dehydration 10/25/2013  . Hypercholesterolemia   . Panic attacks   . Duodenal cancer 09/28/2013  . Hydronephrosis of right kidney 09/16/2013  . Cholestatic jaundice 09/16/2013  . Biliary obstruction due to malignant neoplasm 09/15/2013    PLAN:  1. I personally reviewed and went over laboratory results with the patient.  The results are noted within this dictation. 2. I personally reviewed and went over radiographic studies with the  patient.  The results are noted within this dictation.   3. Problem list reviewed and updated 4. Discussion regarding Hospice. 5. Transition to comfort care only. 6. Patient declines further therapy 7. No role for imaging studies or labs from an oncology standpoint 8. Rx for Restoril 30-60 mg at HS.  If ineffective, he is to call the clinic. 9. Rx refill for pain medication 10. Patient will let us know in the near future about a referral to hospice 11. Return in 4 week, if able, for follow-up.  Patient is encouraged to cancel or reschedule if desired.    THERAPY PLAN:  Comfort care.  Recommend Hospice and he is willing to consider this option in order to help him  and his family at home.  I will refer him to [facility] when he grants me permission to do so.  In the meantime, we will focus on symptom management.  All questions were answered. The patient knows to call the clinic with any problems, questions or concerns. We can certainly see the patient much sooner if necessary.  Patient and plan discussed with Dr. Farrel Gobble and he is in agreement with the aforementioned.   Hilja Kintzel  12/15/2013

## 2013-12-15 ENCOUNTER — Encounter (HOSPITAL_COMMUNITY): Payer: Self-pay | Admitting: Oncology

## 2013-12-15 ENCOUNTER — Encounter (HOSPITAL_COMMUNITY): Payer: Medicare Other | Attending: Hematology and Oncology

## 2013-12-15 ENCOUNTER — Encounter (HOSPITAL_BASED_OUTPATIENT_CLINIC_OR_DEPARTMENT_OTHER): Payer: Medicare Other | Admitting: Oncology

## 2013-12-15 VITALS — BP 113/82 | HR 102 | Temp 97.3°F | Resp 20 | Wt 149.5 lb

## 2013-12-15 DIAGNOSIS — K831 Obstruction of bile duct: Secondary | ICD-10-CM | POA: Insufficient documentation

## 2013-12-15 DIAGNOSIS — C241 Malignant neoplasm of ampulla of Vater: Secondary | ICD-10-CM

## 2013-12-15 DIAGNOSIS — C801 Malignant (primary) neoplasm, unspecified: Secondary | ICD-10-CM

## 2013-12-15 DIAGNOSIS — G4700 Insomnia, unspecified: Secondary | ICD-10-CM | POA: Insufficient documentation

## 2013-12-15 DIAGNOSIS — C170 Malignant neoplasm of duodenum: Secondary | ICD-10-CM | POA: Insufficient documentation

## 2013-12-15 DIAGNOSIS — Z95828 Presence of other vascular implants and grafts: Secondary | ICD-10-CM

## 2013-12-15 DIAGNOSIS — Z452 Encounter for adjustment and management of vascular access device: Secondary | ICD-10-CM

## 2013-12-15 DIAGNOSIS — N183 Chronic kidney disease, stage 3 unspecified: Secondary | ICD-10-CM

## 2013-12-15 DIAGNOSIS — Z9889 Other specified postprocedural states: Secondary | ICD-10-CM | POA: Insufficient documentation

## 2013-12-15 DIAGNOSIS — R630 Anorexia: Secondary | ICD-10-CM

## 2013-12-15 MED ORDER — SODIUM CHLORIDE 0.9 % IJ SOLN: 10.0000 mL | INTRAMUSCULAR | Status: DC | PRN

## 2013-12-15 MED ORDER — TEMAZEPAM 30 MG PO CAPS: 30.0000 mg | ORAL_CAPSULE | Freq: Every evening | ORAL | Status: DC | PRN

## 2013-12-15 MED ORDER — OXYCODONE HCL 5 MG PO TABS: 5.0000 mg | ORAL_TABLET | ORAL | Status: DC | PRN

## 2013-12-15 MED ORDER — HEPARIN SOD (PORK) LOCK FLUSH 100 UNIT/ML IV SOLN
500.0000 [IU] | Freq: Once | INTRAVENOUS | Status: AC
  Administered 2013-12-15: 500 [IU] via INTRAVENOUS
  Filled 2013-12-15: qty 5

## 2013-12-15 NOTE — Progress Notes (Signed)
Trevor Crane presented for Portacath access and flush.  Proper placement of portacath confirmed by CXR.  Portacath located right chest wall accessed with  H 20 needle.  Good blood return present. Portacath flushed with 64ml NS and 500U/66ml Heparin and needle removed intact.  Procedure tolerated well and without incident.

## 2013-12-15 NOTE — Patient Instructions (Addendum)
Trevor Crane Discharge Instructions  RECOMMENDATIONS MADE BY THE CONSULTANT AND ANY TEST RESULTS WILL BE SENT TO YOUR REFERRING PHYSICIAN.  EXAM FINDINGS BY THE PHYSICIAN TODAY AND SIGNS OR SYMPTOMS TO REPORT TO CLINIC OR PRIMARY PHYSICIAN: Exam and findings as discussed by [name], PA-C.  Let us know what you would like to do regarding Hospice.  MEDICATIONS PRESCRIBED:  Restoril 30 mg can take 1 or 2 for sleep.  Call us on Monday and let us know how your sleep is doing Oxycodone - take as directed.  INSTRUCTIONS/FOLLOW-UP: 4 weeks.  Thank you for choosing [facility] to provide your oncology and hematology care.  To afford each patient quality time with our providers, please arrive at least 15 minutes before your scheduled appointment time.  With your help, our goal is to use those 15 minutes to complete the necessary work-up to ensure our physicians have the information they need to help with your evaluation and healthcare recommendations.    Effective January 1st, 2014, we ask that you re-schedule your appointment with our physicians should you arrive 10 or more minutes late for your appointment.  We strive to give you quality time with our providers, and arriving late affects you and other patients whose appointments are after yours.    Again, thank you for choosing [facility].  Our hope is that these requests will decrease the amount of time that you wait before being seen by our physicians.       _____________________________________________________________  Should you have questions after your visit to [facility], please contact our office at [phone] between the hours of 8:30 a.m. and 5:00 p.m.  Voicemails left after 4:30 p.m. will not be returned until the following business day.  For prescription refill requests, have your pharmacy contact our office with your prescription refill request.

## 2013-12-16 ENCOUNTER — Other Ambulatory Visit (HOSPITAL_COMMUNITY): Payer: Self-pay | Admitting: Oncology

## 2013-12-16 ENCOUNTER — Telehealth (HOSPITAL_COMMUNITY): Payer: Self-pay

## 2013-12-16 DIAGNOSIS — G4700 Insomnia, unspecified: Secondary | ICD-10-CM

## 2013-12-16 MED ORDER — ZOLPIDEM TARTRATE 5 MG PO TABS: ORAL_TABLET | ORAL | Status: AC

## 2013-12-16 NOTE — Telephone Encounter (Signed)
Call back from wife and they do want to proceed with referral to [facility].

## 2013-12-16 NOTE — Telephone Encounter (Signed)
Call from Mrs. Rowley.  Stated that Trevor Crane took 2 of the Restoril 30 mg tablets and he did not sleep at all last night and has only napped about 30 minutes today.  Wants to know what else they can do?

## 2013-12-16 NOTE — Telephone Encounter (Signed)
  Mrs. Sacra notified and verbalized understanding of instructions as below.  Will call back on Monday with update.   Baird Cancer, PA-C at 12/16/2013 11:05 AM    Status: Signed       I will try Ambien. He should hold Restoril tonight and only take Ambien tonight. He can take 1 tablet about 1 hour before bedtime. He can take another tablet 1 hour later if not tired/asleep. The medication was called in to [facility]. I called in only 30 tablets to see if it works. If it does, I will be glad to call in more. Please ask him to update [place] as we can add Restoril to this treatment plan in the future if needed.  Per Collie Siad: Call from Mrs. Odowd. Stated that Mr. Dewey took 2 of the Restoril 30 mg tablets and he did not sleep at all last night and has only napped about 30 minutes today. Wants to know what else they can do?       Mellissa Kohut, RN at 12/16/2013 10:46 AM     Status: Signed        Call from Mrs. Marzec. Stated that Mr. Livingstone took 2 of the Restoril 30 mg tablets and he did not sleep at all last night and has only napped about 30 minutes today. Wants to know what else they can do?

## 2013-12-16 NOTE — Telephone Encounter (Signed)
I will try Ambien.  He should hold Restoril tonight and only take Ambien tonight.  He can take 1 tablet about 1 hour before bedtime.  He can take another tablet 1 hour later if not tired/asleep.  The medication was called in to [facility].  I called in only 30 tablets to see if it works.  If it does, I will be glad to call in more.  Please ask him to update [place] as we can add Restoril to this treatment plan in the future if needed.     Per Collie Siad: Call from Mrs. Governale. Stated that Mr. Wynn took 2 of the Restoril 30 mg tablets and he did not sleep at all last night and has only napped about 30 minutes today. Wants to know what else they can do?

## 2013-12-17 ENCOUNTER — Telehealth (HOSPITAL_COMMUNITY): Payer: Self-pay

## 2013-12-17 NOTE — Telephone Encounter (Signed)
Call to Mrs. Uhls to check how Mr. Seales slept last night.  Stated that he took the first ambien at 8 pm and the second one at 9:30 pm and slept from 2am untl 8am.  Were pleased with results of ambien.  Hospice will be seeing patient this afternoon.

## 2013-12-20 ENCOUNTER — Other Ambulatory Visit (HOSPITAL_COMMUNITY): Payer: Self-pay | Admitting: Oncology

## 2013-12-20 DIAGNOSIS — G4700 Insomnia, unspecified: Secondary | ICD-10-CM

## 2013-12-20 MED ORDER — TRAZODONE HCL 50 MG PO TABS: 50.0000 mg | ORAL_TABLET | Freq: Every day | ORAL | Status: AC

## 2013-12-21 ENCOUNTER — Encounter (HOSPITAL_COMMUNITY): Payer: Self-pay | Admitting: Oncology

## 2013-12-23 ENCOUNTER — Telehealth (HOSPITAL_COMMUNITY): Payer: Self-pay | Admitting: Oncology

## 2013-12-23 ENCOUNTER — Telehealth (HOSPITAL_COMMUNITY): Payer: Self-pay

## 2013-12-23 NOTE — Telephone Encounter (Signed)
Instructions from below discussed with wife.  Stated "he hasn't had a lot of pain so he's not taking the Oxy IR much at all." (not taking dilaudid at all) Instructed to increased prozac to 40 mg daily, take Oxycodone 5 mg and Ambien 5 mg at HS and can repeat ambien in 1 hour.  Verbalized understanding of instructions.   To call office tomorrow with update.

## 2013-12-23 NOTE — Telephone Encounter (Signed)
Message copied by Mellissa Kohut on Thu Dec 23, 2013  3:24 PM ------      Message from: Baird Cancer      Created: Thu Dec 23, 2013  2:24 PM       Here's the game plan:            1. Increase Prozac to 40 mg daily (compared to 20 mg).            2.           A. Take 2 mg of Dilaudid at bedtime                                    Or          B. 10 mg of Oxy Ir at bedtime            3. 5 mg of Ambien at bedtime and may repeat 1 hour later (10 mg total)            Call tomorrow with update       ------

## 2013-12-24 ENCOUNTER — Telehealth (HOSPITAL_COMMUNITY): Payer: Self-pay

## 2013-12-24 NOTE — Telephone Encounter (Signed)
Message copied by Mellissa Kohut on Fri Dec 24, 2013 10:31 AM ------      Message from: Baird Cancer      Created: Fri Dec 24, 2013 10:00 AM       Add Ativan 1 mg to his regimen.            He can take 1-3 mg of Ativan in addition to Oxycodone 10 mg at HS + 10 mg of Ambien + 40 mg of Prozac.            I would take 1 tablet of 1 mg of Ativan at bedtime and repeat it 1 hour later if needed, and then 1 hour later.   ------

## 2013-12-24 NOTE — Telephone Encounter (Signed)
Instructed as below. Verbalized understanding of instructions.

## 2013-12-24 NOTE — Telephone Encounter (Signed)
Per wife, patient took additional prozac, ambien 5mg  at 9pm & oxycodone 2 tabs.  At 10:15pm took additional [place].  Did not go to sleep until after 5 am and only slept 1 hour.  Currently has been asleep for about 1 hour.  "What to do next?"

## 2013-12-27 ENCOUNTER — Telehealth (HOSPITAL_COMMUNITY): Payer: Self-pay

## 2013-12-27 ENCOUNTER — Other Ambulatory Visit (HOSPITAL_COMMUNITY): Payer: Self-pay | Admitting: Oncology

## 2013-12-27 DIAGNOSIS — F329 Major depressive disorder, single episode, unspecified: Secondary | ICD-10-CM

## 2013-12-27 DIAGNOSIS — F32A Depression, unspecified: Secondary | ICD-10-CM

## 2013-12-27 MED ORDER — FLUOXETINE HCL 40 MG PO CAPS: 40.0000 mg | ORAL_CAPSULE | Freq: Every day | ORAL | Status: AC

## 2013-12-27 NOTE — Telephone Encounter (Signed)
I wrote and escribed a new Prozac Rx for 40 mg daily (1 tab).  I am happy to hear that he slept.  He is to continue with the same sleeping regimen.  If this becomes ineffective, he can increase to another Ativan tablet to a dose of 3 mg.  He may contact us if he has any issues.   Catheter tubing exchange is up to him.  I would recommend he discuss this with his Hospice nurse.  There is not a right or a wrong answer.

## 2013-12-27 NOTE — Telephone Encounter (Signed)
Call from Mrs. Glaze states that Mr. Yett did sleep better this weekend.  Following previous regimen and maximum dosage of ativan was 2 tablets.  Sleep 3 + hours.  Needs prescription for Prozac with increased dosage ran out sooner.  Questions if it's necessary to go for change catheter tubing with IR on Wednesday?  Doesn't want it to clog up but wants to know if needs to go?

## 2013-12-28 ENCOUNTER — Telehealth (HOSPITAL_COMMUNITY): Payer: Self-pay

## 2013-12-28 NOTE — Telephone Encounter (Signed)
Baird Cancer, PA-C at 12/27/2013 12:10 PM     Status: Signed        I wrote and escribed a new Prozac Rx for 40 mg daily (1 tab). I am happy to hear that he slept. He is to continue with the same sleeping regimen. If this becomes ineffective, he can increase to another Ativan tablet to a dose of 3 mg. He may contact us if he has any issues.  Catheter tubing exchange is up to him. I would recommend he discuss this with his Hospice nurse. There is not a right or a wrong answer.   Spoke with Mrs. Yard yesterday and instructed as above.

## 2013-12-29 ENCOUNTER — Telehealth (HOSPITAL_COMMUNITY): Payer: Self-pay | Admitting: Oncology

## 2013-12-29 ENCOUNTER — Other Ambulatory Visit (HOSPITAL_COMMUNITY): Payer: Medicare Other

## 2013-12-29 ENCOUNTER — Telehealth (HOSPITAL_COMMUNITY): Payer: Self-pay

## 2013-12-29 NOTE — Telephone Encounter (Signed)
Baird Cancer, PA-C at 12/29/2013 4:04 PM     Status: Signed        I recommend escalating Ativan dose. Start with 2 mg at bedtime and continue to increase by 1 mg every hour if still awake. Please update [place] in the future regarding status. Also remind the patient/wife that they do not need to be in the middle of this and Hospice is the one who should be contacting us. I feel bad that she is in the middle of this whereas Hospice should be. However, if she feels comfortable being the "middleman," then by all means. I want to make sure they are benefiting from the services Hospice can provide.   Trevor Crane with [facility] and she will follow instructions as above regarding ativan.  She will also talk with the Hospice RN tomorrow about their assuming a more active role in his assessments and getting his needs met.  She will call me with an update tomorrow morning.

## 2013-12-29 NOTE — Telephone Encounter (Signed)
I recommend escalating Ativan dose.  Start with 2 mg at bedtime and continue to increase by 1 mg every hour if still awake.  Please update [place] in the future regarding status.  Also remind the patient/wife that they do not need to be in the middle of this and Hospice is the one who should be contacting us.  I feel bad that she is in the middle of this whereas Hospice should be.  However, if she feels comfortable being the "middleman," then by all means.  I want to make sure they are benefiting from the services Hospice can provide.

## 2013-12-30 ENCOUNTER — Other Ambulatory Visit (HOSPITAL_COMMUNITY): Payer: Self-pay | Admitting: Oncology

## 2013-12-30 ENCOUNTER — Telehealth (HOSPITAL_COMMUNITY): Payer: Self-pay

## 2013-12-30 DIAGNOSIS — G4700 Insomnia, unspecified: Secondary | ICD-10-CM

## 2013-12-30 MED ORDER — LORAZEPAM 2 MG PO TABS: 2.0000 mg | ORAL_TABLET | Freq: Four times a day (QID) | ORAL | Status: AC | PRN

## 2013-12-30 NOTE — Telephone Encounter (Signed)
Per wife - Trevor Crane took his 2 oxycodone , 2 ambien and added lorazepam 1 mg around 9 pm and took another ativan @ 10 pm and another one at 12 midnight.  After the one at midnight he did not take anymore because when he got up to go to the bathroom, he got dizzy and almost fell. Slept approximately 4 1/2 hours all together.  Discussed with PA and new prescription for Ativan 2mg  tablets to be sent to the pharmacy.  Patient is to continue to take the oxycodone and ambien as before but will use 2 mg ativan tablets instead of 1 mg tablets at HS.  Trevor Crane verbalized understanding of instructions.

## 2014-01-04 ENCOUNTER — Other Ambulatory Visit (HOSPITAL_COMMUNITY): Payer: Self-pay | Admitting: Oncology

## 2014-01-04 ENCOUNTER — Telehealth (HOSPITAL_COMMUNITY): Payer: Self-pay | Admitting: *Deleted

## 2014-01-04 DIAGNOSIS — C170 Malignant neoplasm of duodenum: Secondary | ICD-10-CM

## 2014-01-04 DIAGNOSIS — C801 Malignant (primary) neoplasm, unspecified: Secondary | ICD-10-CM

## 2014-01-04 DIAGNOSIS — K831 Obstruction of bile duct: Secondary | ICD-10-CM

## 2014-01-04 MED ORDER — OXYCODONE HCL 5 MG PO TABS: 5.0000 mg | ORAL_TABLET | ORAL | Status: AC | PRN

## 2014-01-04 NOTE — Telephone Encounter (Signed)
Hospice Nurse called and said Trevor Crane need refill on Oxycodone 5 mg and send to the [facility] Fax # [phone] and please put Hospice Patient on the Script.

## 2014-01-12 ENCOUNTER — Other Ambulatory Visit (HOSPITAL_COMMUNITY): Payer: Medicare Other

## 2014-01-12 ENCOUNTER — Ambulatory Visit (HOSPITAL_COMMUNITY): Payer: Medicare Other | Admitting: Oncology

## 2014-01-14 ENCOUNTER — Telehealth (HOSPITAL_COMMUNITY): Payer: Self-pay

## 2014-01-14 NOTE — Telephone Encounter (Signed)
Call to check on status.  Spoke with wife Trevor Crane and stated that husband was weaker, not eating much but over all doing ok.  Hospice coming twice weekly and she feels much better supported.  Instructed that she could call anytime if they needed anything.

## 2014-01-17 ENCOUNTER — Telehealth (HOSPITAL_COMMUNITY): Payer: Self-pay | Admitting: Oncology

## 2014-01-17 ENCOUNTER — Other Ambulatory Visit (HOSPITAL_COMMUNITY): Payer: Self-pay | Admitting: Oncology

## 2014-01-17 DIAGNOSIS — C241 Malignant neoplasm of ampulla of Vater: Secondary | ICD-10-CM

## 2014-01-17 MED ORDER — MORPHINE SULFATE (CONCENTRATE) 20 MG/ML PO SOLN: 10.0000 mg | ORAL | Status: AC | PRN

## 2014-01-17 NOTE — Telephone Encounter (Signed)
Otila Kluver, Hospice Nurse, called.   Mr. Sautter is actively dying.  She requested a refill on his morphine sulfate 20 mg/mL and this was printed and faxed to [facility].   [facility] 2014-02-16

## 2014-01-28 DEATH — deceased

## 2014-10-13 ENCOUNTER — Encounter (HOSPITAL_COMMUNITY): Payer: Self-pay | Admitting: Internal Medicine

## 2015-10-09 IMAGING — US US RENAL
1 series · 14 of 25 positions shown · non-contrast
Comparison: CT 09/15/2013.

CLINICAL DATA: Hydronephrosis.

EXAM:
RENAL/URINARY TRACT ULTRASOUND COMPLETE

[Series 1: us renal · 0.24mm/px · 14 of 40 slices shown]
[im 1/40]
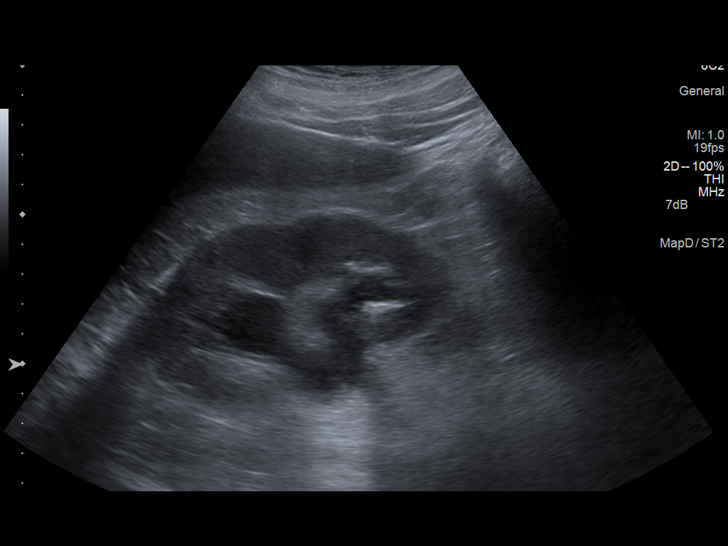
[im 4/40]
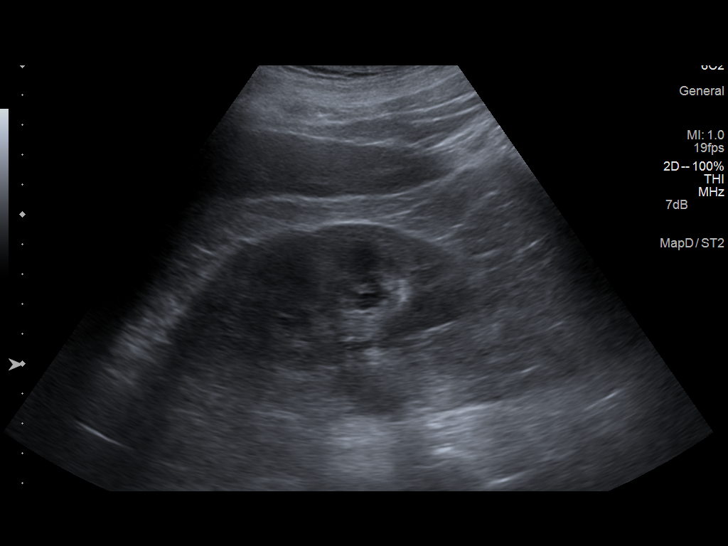
[im 7/40]
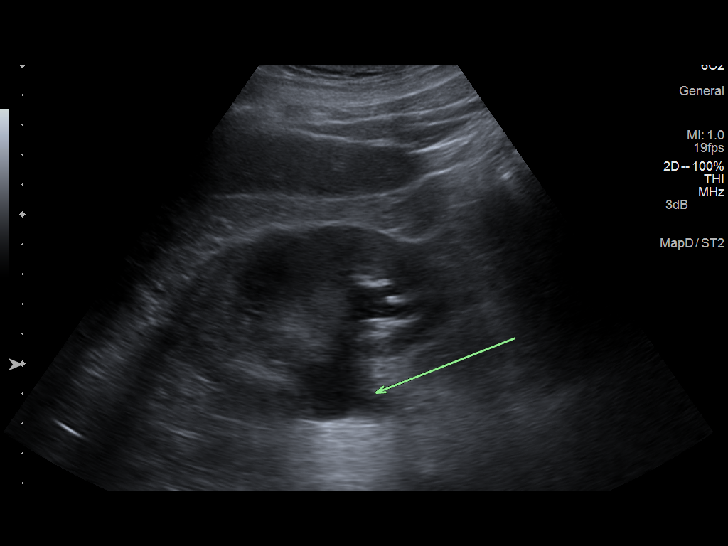
[im 10/40]
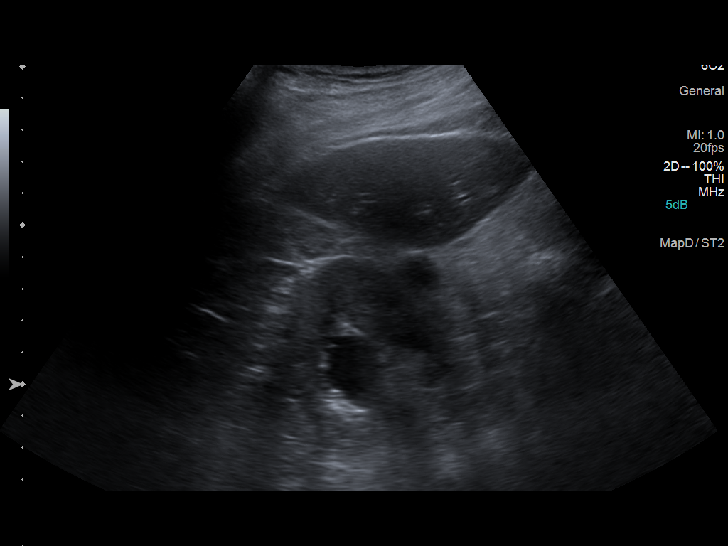
[im 14/40]
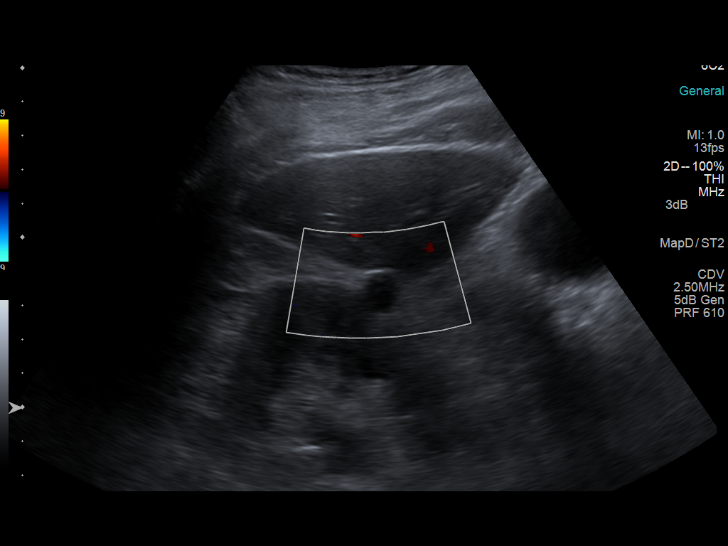
[im 15/40]
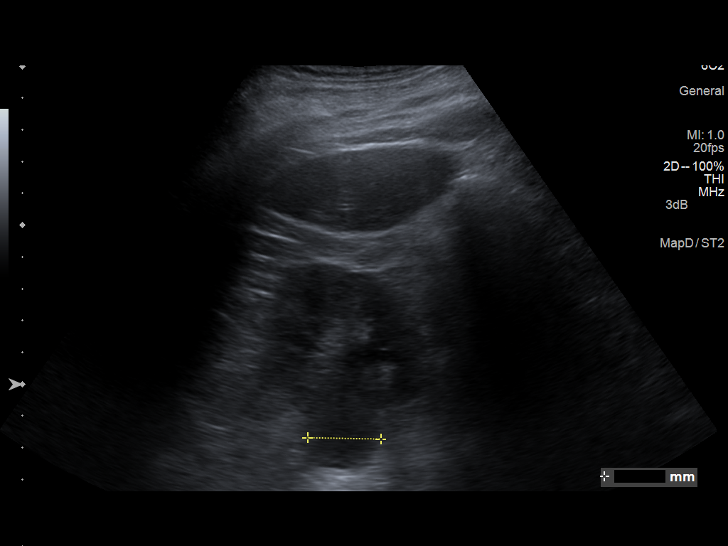
[im 18/40]
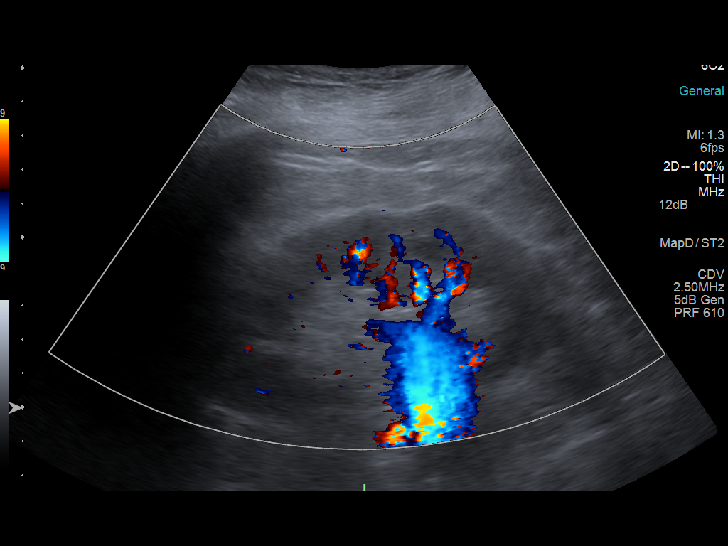
[im 22/40]
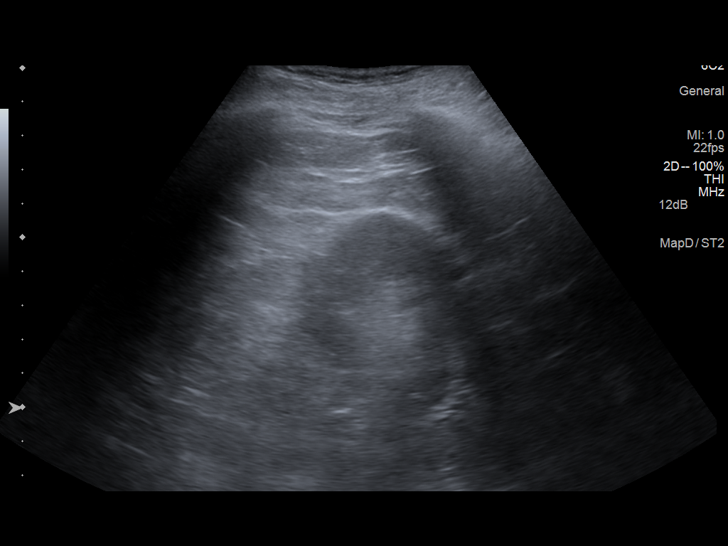
[im 25/40]
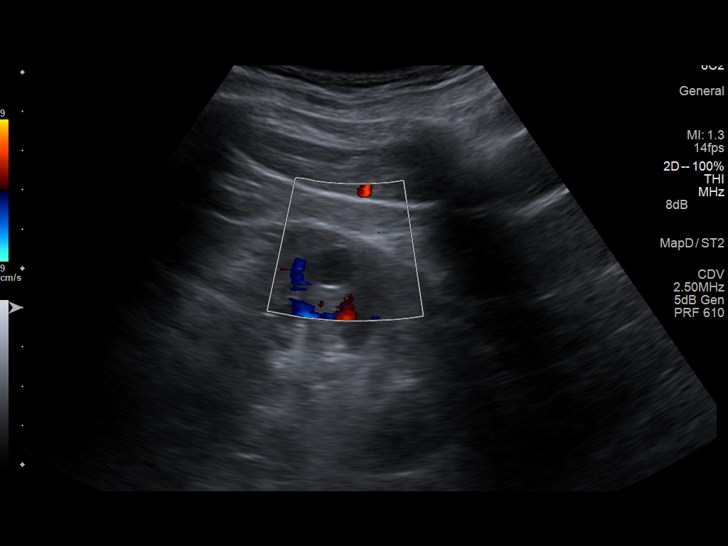
[im 27/40]
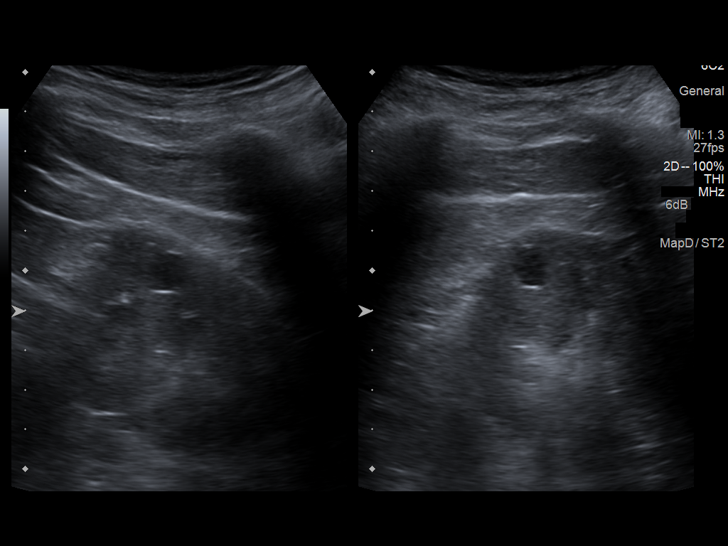
[im 30/40]
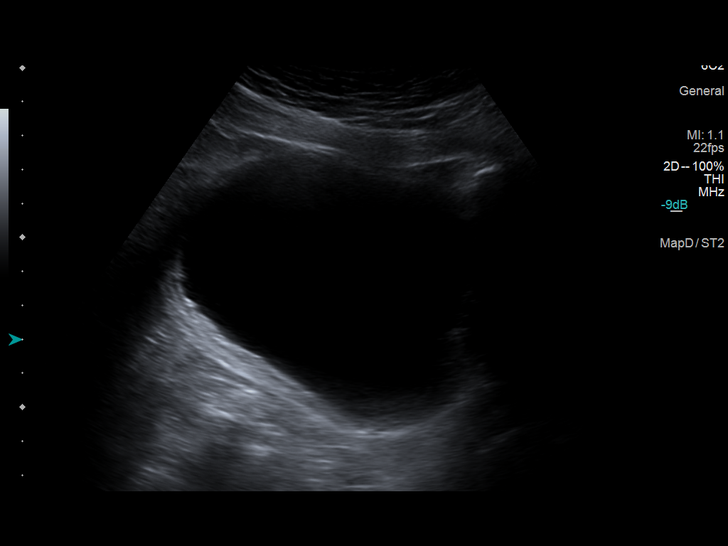
[im 33/40]
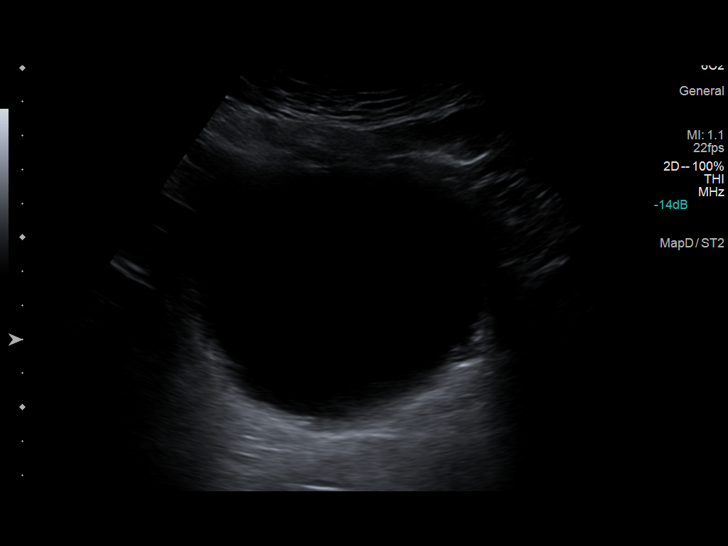
[im 36/40]
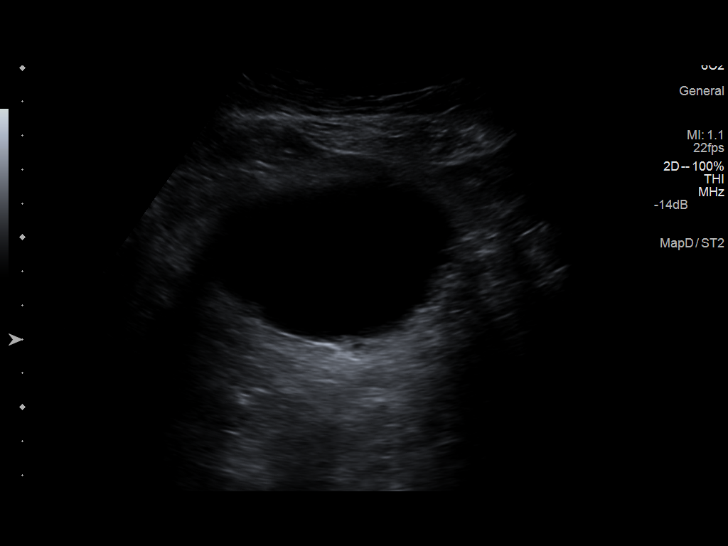
[im 40/40]
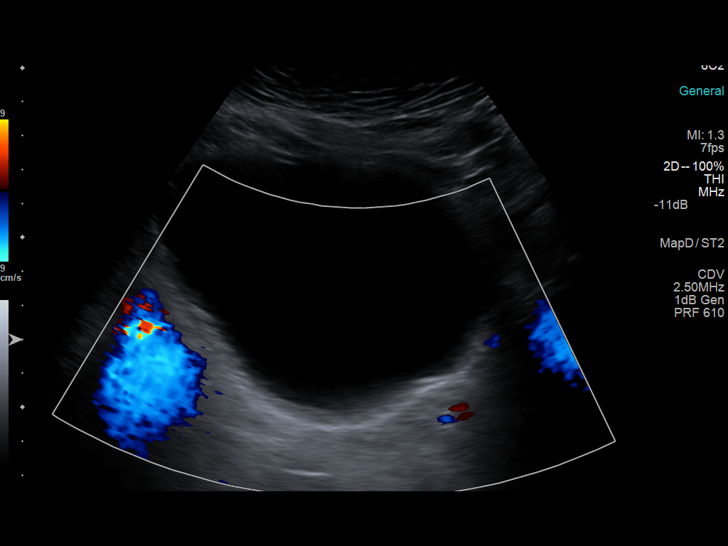

[14 of 25 positions shown; findings below may reference images not displayed]

FINDINGS: Right Kidney:

Length: 10.4 cm.. Echogenicity is normal. Mild hydronephrosis noted.
A 1.2 cm simple cyst is noted mid portion of the kidney
corresponding to CT finding. Lower pole right kidney not well
visualized. Previously identified possibly solid indeterminate
lesion in lower pole right kidney not well visualized.

Left Kidney:

Length: 10.7 cm. Echogenicity within normal limits. No mass or
hydronephrosis visualized.

Bladder:

Appears normal for degree of bladder distention. Right ureteral jet
not identified.
IMPRESSION: 1. Mild right hydronephrosis.
2. Simple cyst midportion right kidney. Small indeterminate lesion
noted in the inferior pole of the right kidney on CT not well
identified.

## 2015-11-16 IMAGING — CR DG CHEST 2V
2 series · 2 of 2 positions shown · non-contrast
Comparison: None.

CLINICAL DATA: Hypotension

EXAM:
CHEST  2 VIEW

[view not recorded (1 of 2)]
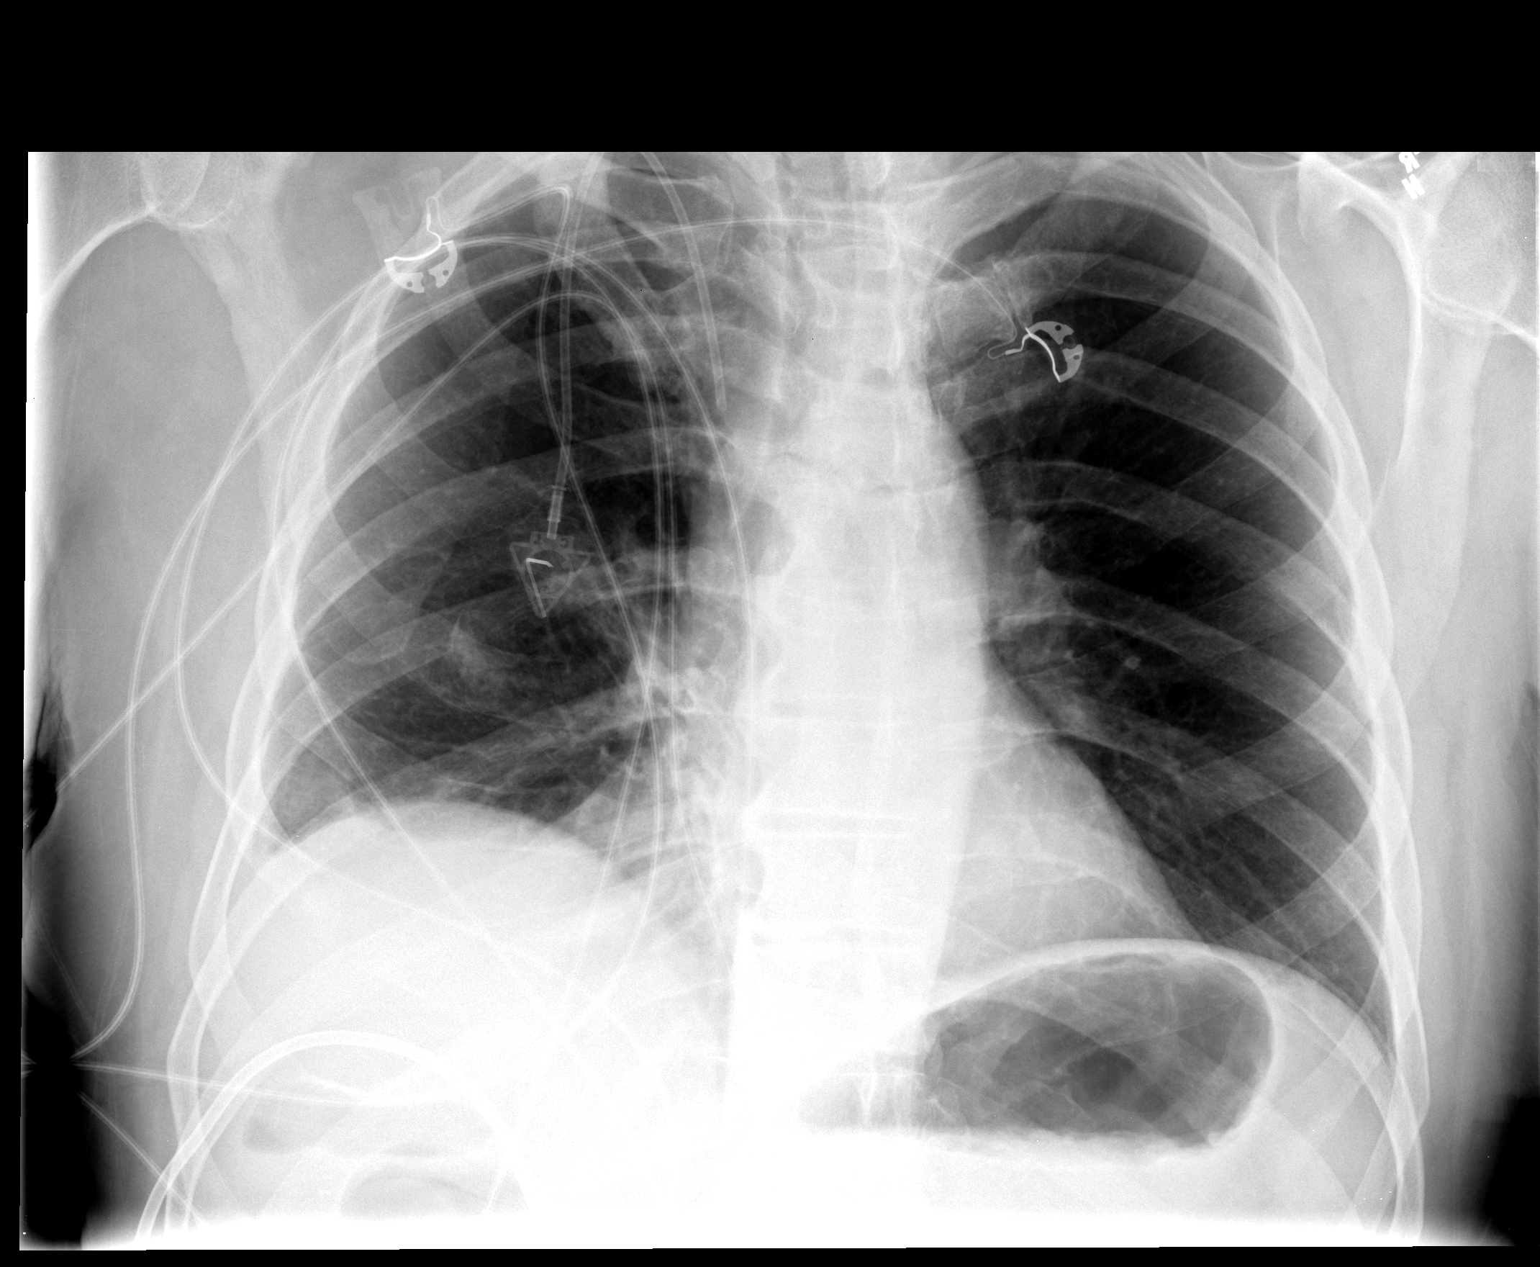

[view not recorded (2 of 2)]
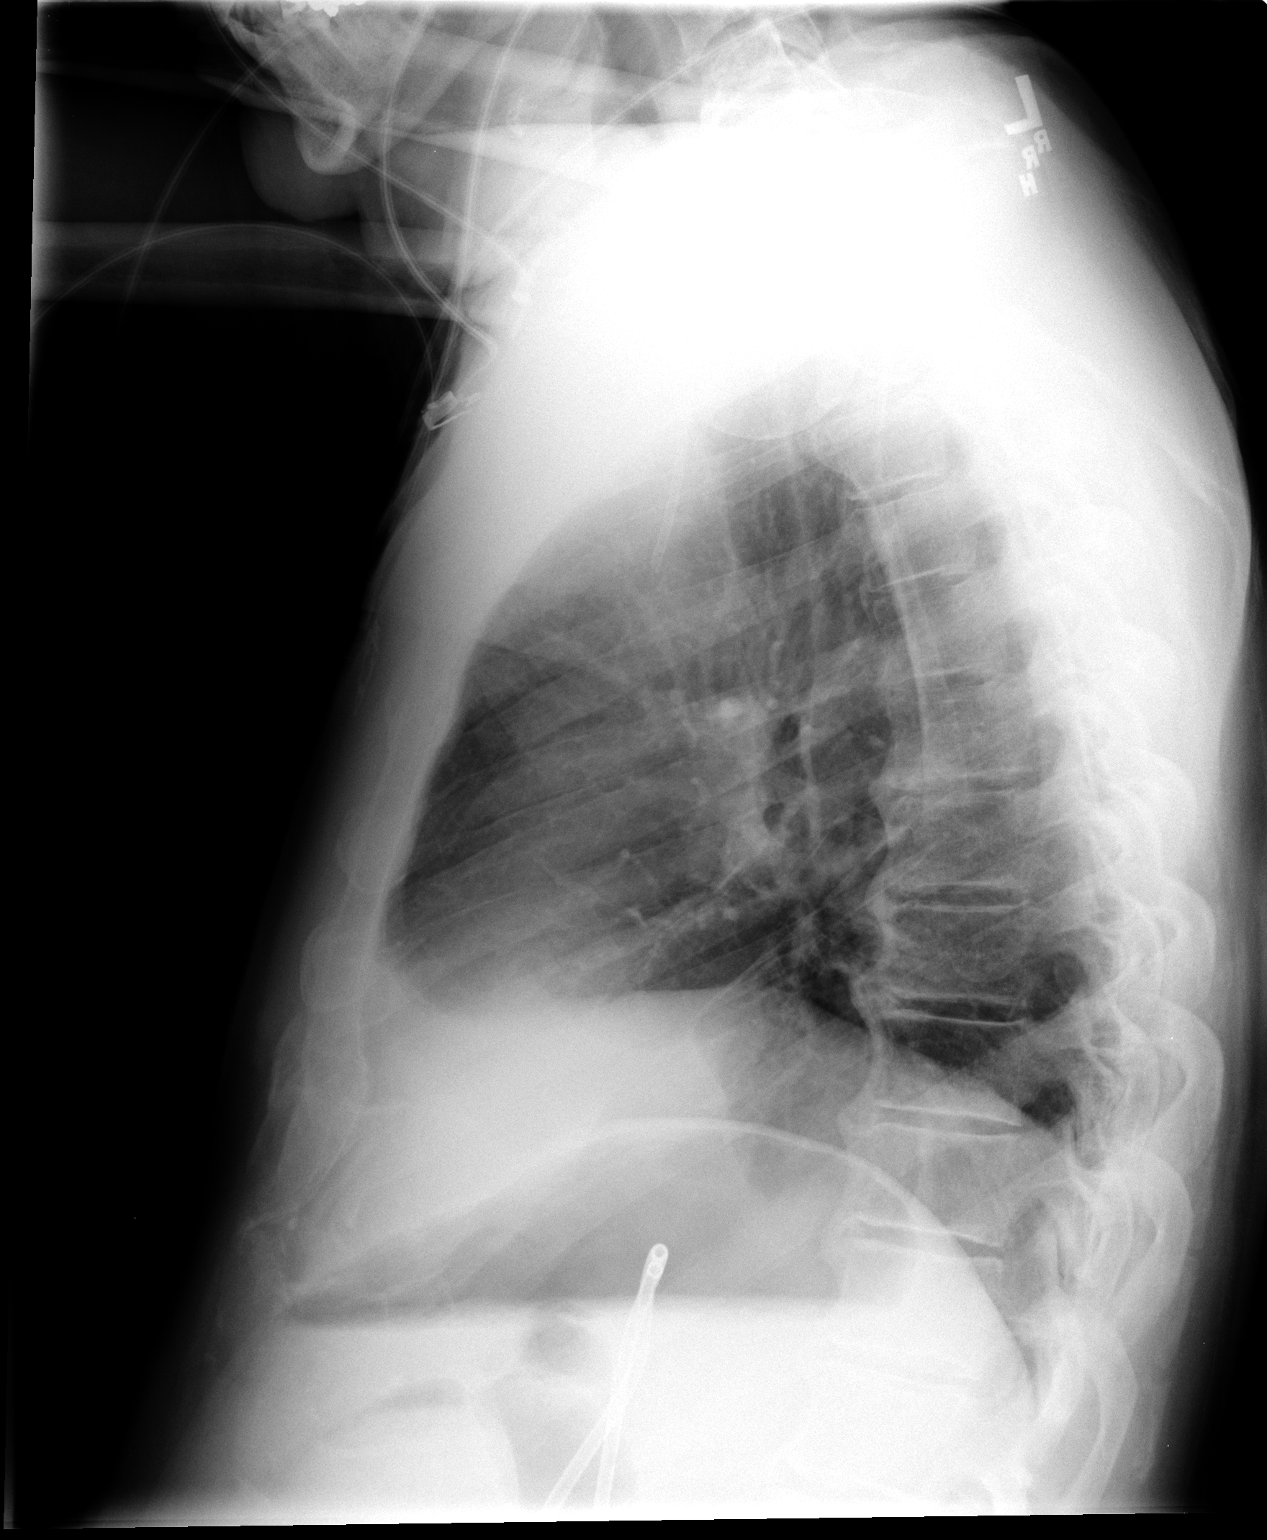

[2 of 2 positions shown; findings below may reference images not displayed]

FINDINGS: Lungs are essentially clear. No focal consolidation. No pleural
effusion or pneumothorax.

The heart is normal in size.

Right chest power port terminating in the mid SVC.

Suspected external biliary drain overlying the right upper abdomen,
incompletely visualized.
IMPRESSION: No evidence of acute cardiopulmonary disease.

## 2016-10-01 ENCOUNTER — Other Ambulatory Visit: Payer: Self-pay | Admitting: Nurse Practitioner
